# Patient Record
Sex: Male | Born: 1985 | Race: White | Hispanic: No | Marital: Single | Smoking: Never smoker
Health system: Southern US, Community
[De-identification: ages and names within clinical notes are randomized; demographics above are authoritative.]

## PROBLEM LIST (undated history)

## (undated) DIAGNOSIS — F419 Anxiety disorder, unspecified: Secondary | ICD-10-CM

## (undated) DIAGNOSIS — Z87442 Personal history of urinary calculi: Secondary | ICD-10-CM

---

## 2005-05-18 ENCOUNTER — Emergency Department (HOSPITAL_COMMUNITY): Admission: EM | Admit: 2005-05-18 | Discharge: 2005-05-18 | Payer: Self-pay | Admitting: Emergency Medicine

## 2006-03-18 ENCOUNTER — Emergency Department (HOSPITAL_COMMUNITY): Admission: EM | Admit: 2006-03-18 | Discharge: 2006-03-18 | Payer: Self-pay | Admitting: Emergency Medicine

## 2006-05-13 ENCOUNTER — Emergency Department (HOSPITAL_COMMUNITY): Admission: EM | Admit: 2006-05-13 | Discharge: 2006-05-14 | Payer: Self-pay | Admitting: Emergency Medicine

## 2006-06-23 ENCOUNTER — Ambulatory Visit: Payer: Self-pay | Admitting: Family Medicine

## 2006-07-19 ENCOUNTER — Ambulatory Visit: Payer: Self-pay | Admitting: Family Medicine

## 2011-04-30 ENCOUNTER — Emergency Department (HOSPITAL_COMMUNITY)
Admission: EM | Admit: 2011-04-30 | Discharge: 2011-04-30 | Disposition: A | Discharge status: Home / Self Care | Payer: Self-pay | Attending: Emergency Medicine | Admitting: Emergency Medicine

## 2011-04-30 ENCOUNTER — Emergency Department (HOSPITAL_COMMUNITY): Payer: Self-pay

## 2011-04-30 DIAGNOSIS — M129 Arthropathy, unspecified: Secondary | ICD-10-CM | POA: Insufficient documentation

## 2011-04-30 DIAGNOSIS — S0003XA Contusion of scalp, initial encounter: Secondary | ICD-10-CM | POA: Insufficient documentation

## 2011-04-30 DIAGNOSIS — M2550 Pain in unspecified joint: Secondary | ICD-10-CM | POA: Insufficient documentation

## 2011-04-30 DIAGNOSIS — S6000XA Contusion of unspecified finger without damage to nail, initial encounter: Secondary | ICD-10-CM | POA: Insufficient documentation

## 2011-04-30 DIAGNOSIS — M79609 Pain in unspecified limb: Secondary | ICD-10-CM | POA: Insufficient documentation

## 2011-04-30 DIAGNOSIS — S62009A Unspecified fracture of navicular [scaphoid] bone of unspecified wrist, initial encounter for closed fracture: Secondary | ICD-10-CM | POA: Insufficient documentation

## 2011-04-30 DIAGNOSIS — M7989 Other specified soft tissue disorders: Secondary | ICD-10-CM | POA: Insufficient documentation

## 2011-04-30 DIAGNOSIS — F329 Major depressive disorder, single episode, unspecified: Secondary | ICD-10-CM | POA: Insufficient documentation

## 2011-04-30 DIAGNOSIS — F3289 Other specified depressive episodes: Secondary | ICD-10-CM | POA: Insufficient documentation

## 2011-04-30 DIAGNOSIS — IMO0002 Reserved for concepts with insufficient information to code with codable children: Secondary | ICD-10-CM | POA: Insufficient documentation

## 2011-04-30 DIAGNOSIS — R51 Headache: Secondary | ICD-10-CM | POA: Insufficient documentation

## 2011-05-15 ENCOUNTER — Emergency Department (HOSPITAL_COMMUNITY)
Admission: EM | Admit: 2011-05-15 | Discharge: 2011-05-15 | Disposition: A | Discharge status: Other Acute Inpatient Hospital | Payer: Self-pay | Source: Home / Self Care | Attending: Emergency Medicine | Admitting: Emergency Medicine

## 2011-05-15 ENCOUNTER — Emergency Department (HOSPITAL_COMMUNITY): Payer: Self-pay

## 2011-05-15 ENCOUNTER — Inpatient Hospital Stay (HOSPITAL_COMMUNITY): Payer: Self-pay

## 2011-05-15 ENCOUNTER — Inpatient Hospital Stay (HOSPITAL_COMMUNITY)
Admission: EM | Admit: 2011-05-15 | Discharge: 2011-05-16 | DRG: 087 | Disposition: A | Discharge status: Home / Self Care | Payer: Self-pay | Source: Ambulatory Visit | Attending: Neurosurgery | Admitting: Neurosurgery

## 2011-05-15 DIAGNOSIS — S02109A Fracture of base of skull, unspecified side, initial encounter for closed fracture: Principal | ICD-10-CM | POA: Diagnosis present

## 2011-05-15 DIAGNOSIS — F1010 Alcohol abuse, uncomplicated: Secondary | ICD-10-CM | POA: Insufficient documentation

## 2011-05-15 DIAGNOSIS — F3289 Other specified depressive episodes: Secondary | ICD-10-CM | POA: Diagnosis present

## 2011-05-15 DIAGNOSIS — Y9229 Other specified public building as the place of occurrence of the external cause: Secondary | ICD-10-CM

## 2011-05-15 DIAGNOSIS — R51 Headache: Secondary | ICD-10-CM | POA: Insufficient documentation

## 2011-05-15 DIAGNOSIS — S62009A Unspecified fracture of navicular [scaphoid] bone of unspecified wrist, initial encounter for closed fracture: Secondary | ICD-10-CM | POA: Diagnosis present

## 2011-05-15 DIAGNOSIS — F17200 Nicotine dependence, unspecified, uncomplicated: Secondary | ICD-10-CM | POA: Diagnosis present

## 2011-05-15 DIAGNOSIS — F329 Major depressive disorder, single episode, unspecified: Secondary | ICD-10-CM | POA: Diagnosis present

## 2011-05-15 DIAGNOSIS — IMO0002 Reserved for concepts with insufficient information to code with codable children: Secondary | ICD-10-CM | POA: Diagnosis present

## 2011-05-15 DIAGNOSIS — I609 Nontraumatic subarachnoid hemorrhage, unspecified: Secondary | ICD-10-CM | POA: Insufficient documentation

## 2011-05-15 DIAGNOSIS — Y998 Other external cause status: Secondary | ICD-10-CM

## 2011-05-15 LAB — MRSA PCR SCREENING: MRSA by PCR: NEGATIVE

## 2011-05-15 LAB — POCT I-STAT, CHEM 8
BUN: 12 mg/dL (ref 6–23)
Calcium, Ion: 1.14 mmol/L (ref 1.12–1.32)
Chloride: 105 mEq/L (ref 96–112)
Hemoglobin: 16.7 g/dL (ref 13.0–17.0)
TCO2: 23 mmol/L (ref 0–100)

## 2011-05-15 LAB — BLOOD GAS, ARTERIAL
Drawn by: 308601
FIO2: 0.21 %
O2 Saturation: 95.9 %
Patient temperature: 37
pH, Arterial: 7.321 — ABNORMAL LOW (ref 7.350–7.450)
pO2, Arterial: 98.9 mmHg (ref 80.0–100.0)

## 2011-05-15 LAB — ETHANOL: Alcohol, Ethyl (B): 228 mg/dL — ABNORMAL HIGH (ref 0–11)

## 2011-05-16 ENCOUNTER — Inpatient Hospital Stay (HOSPITAL_COMMUNITY): Payer: Self-pay
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
## 2012-11-24 IMAGING — CR DG HAND COMPLETE 3+V*L*
4 series · 4 of 4 positions shown · non-contrast
Comparison: 04/30/2011.

CLINICAL DATA: 25-year-old male with wrist and hand injury.

LEFT HAND - COMPLETE 3+ VIEW

[x hand pa left]
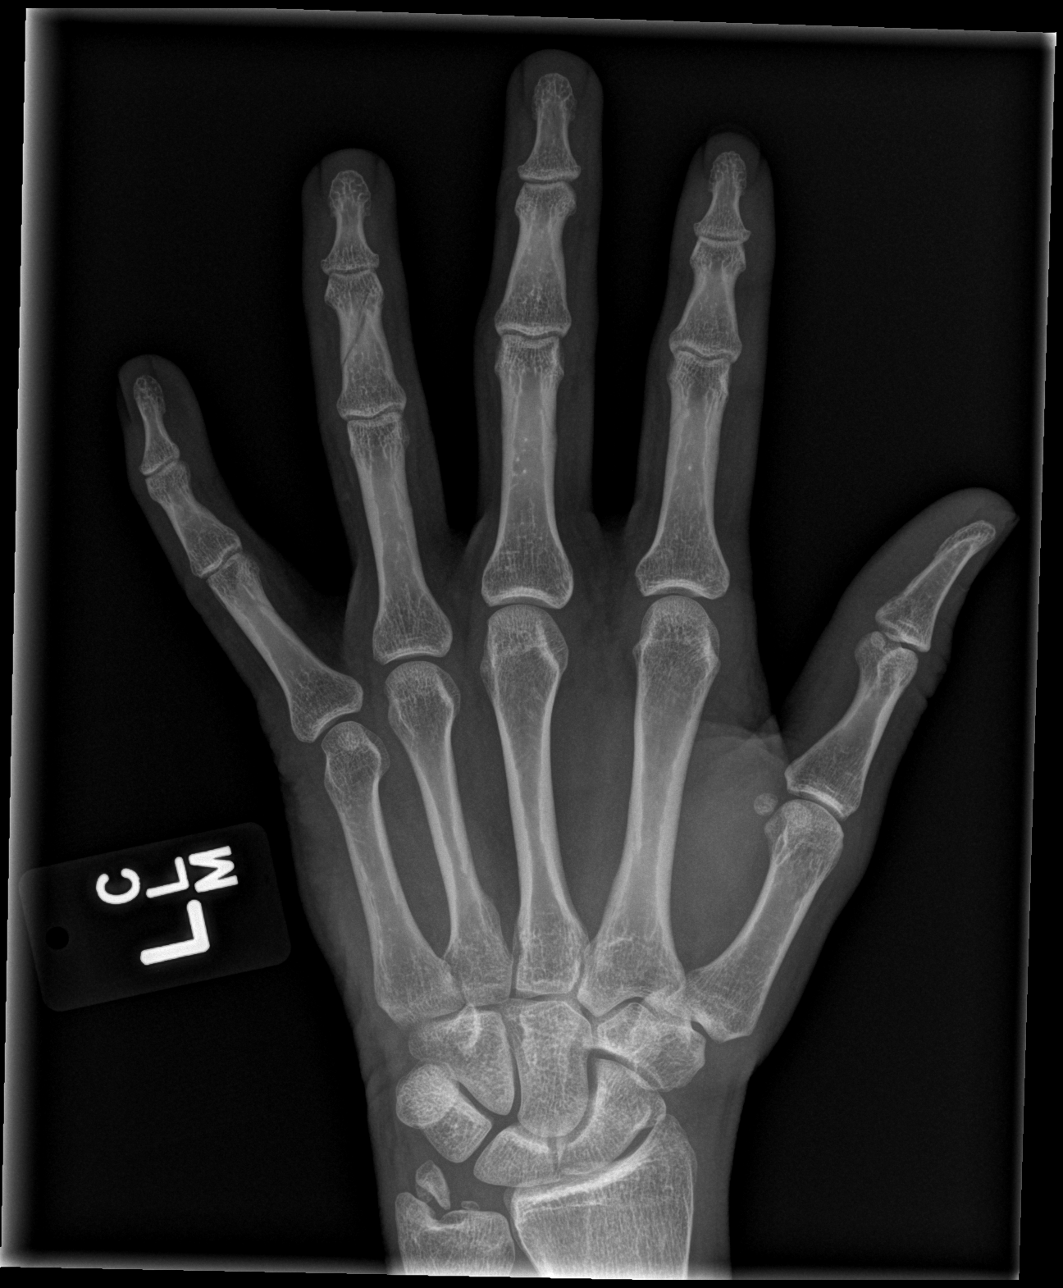

[x hand obl left (1 of 2)]
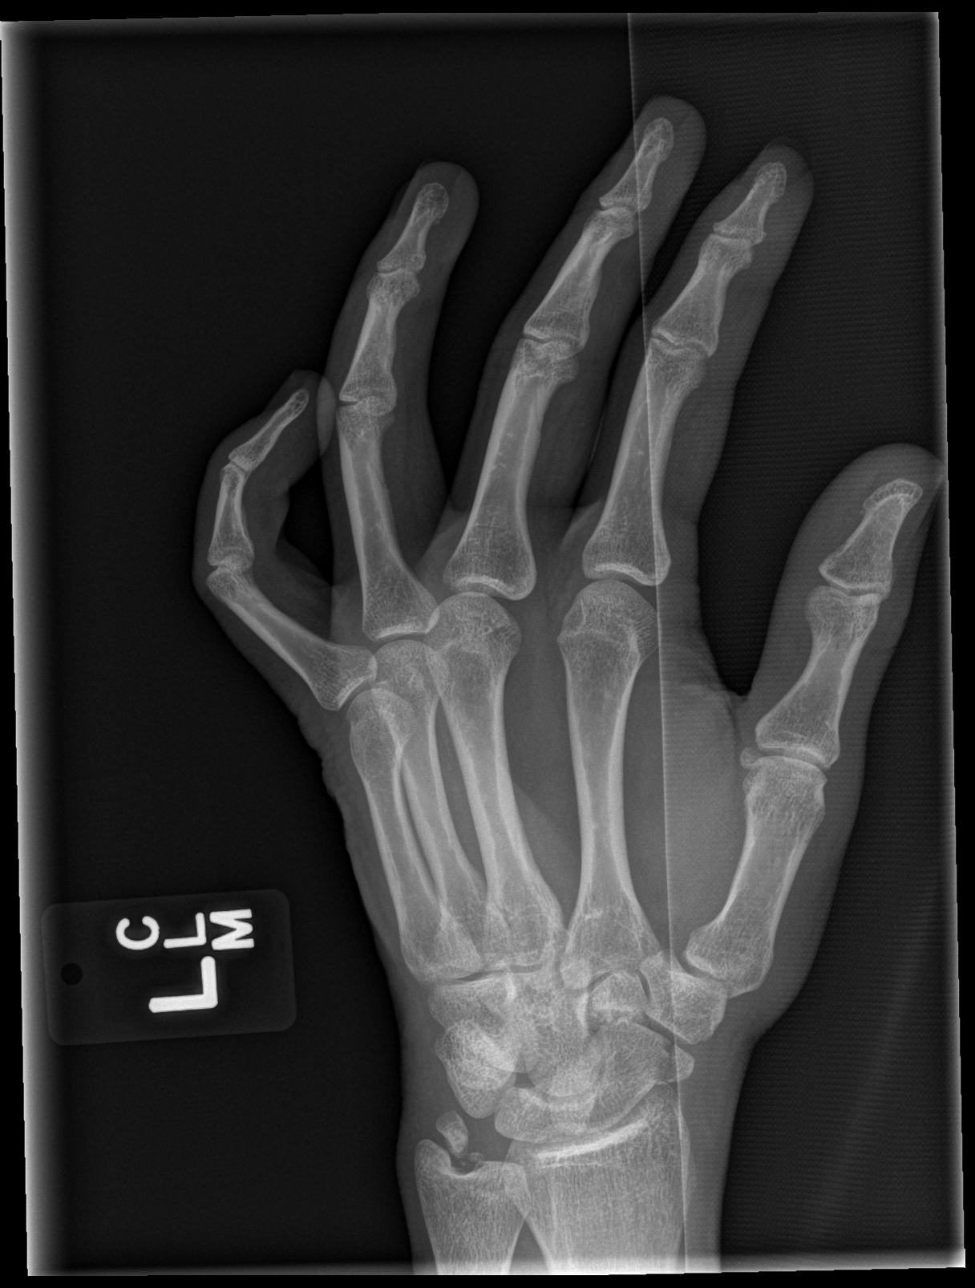

[x hand obl left (2 of 2)]
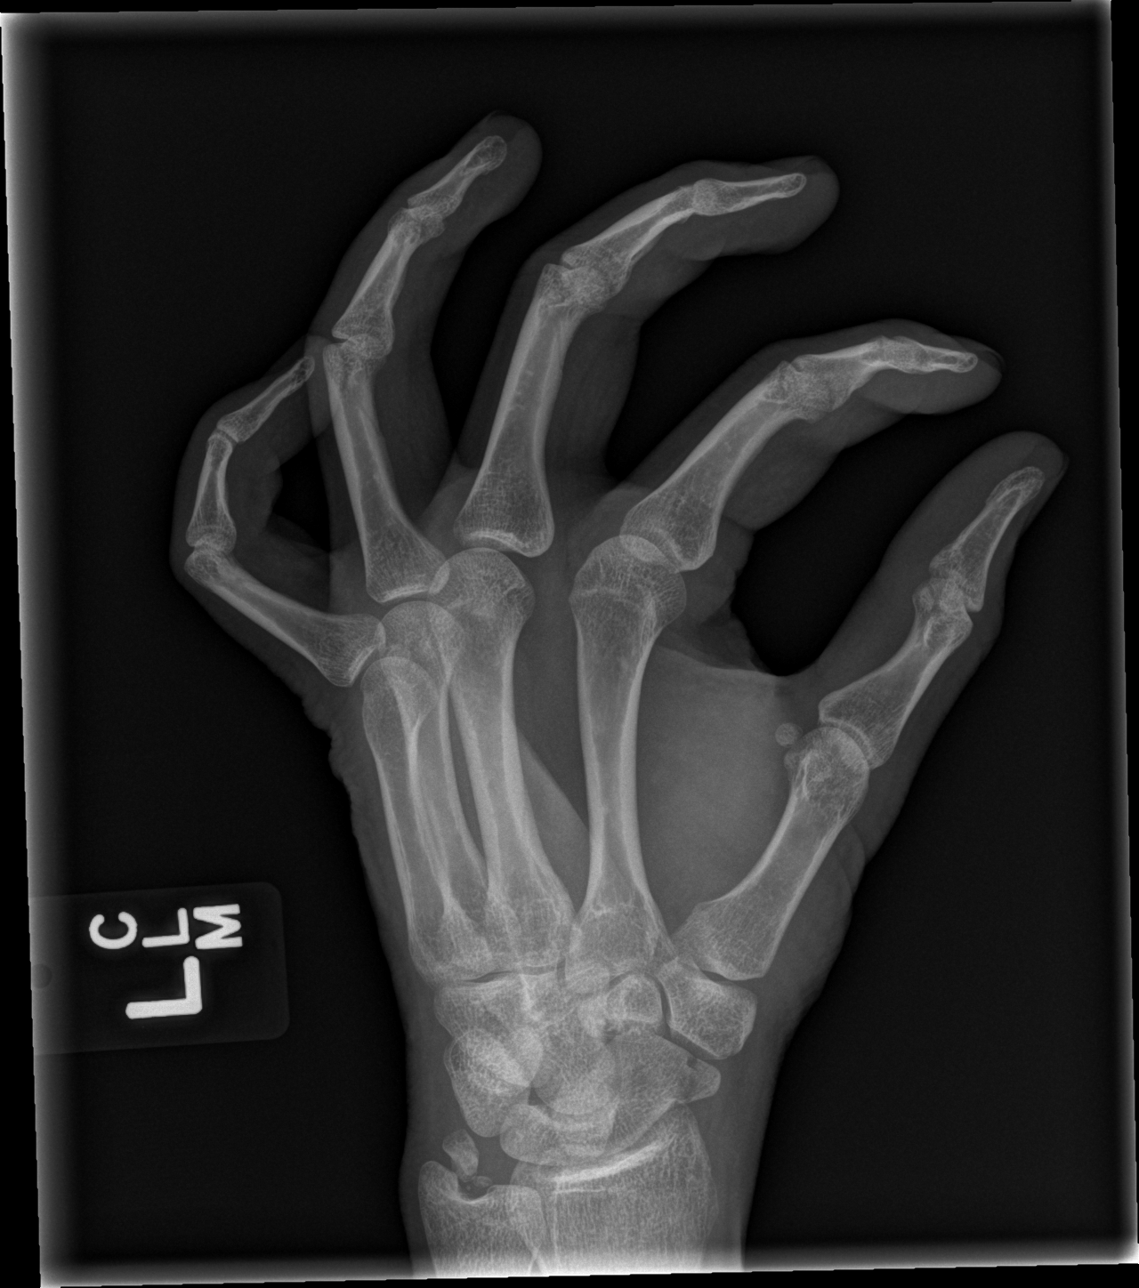

[x hand lat left]
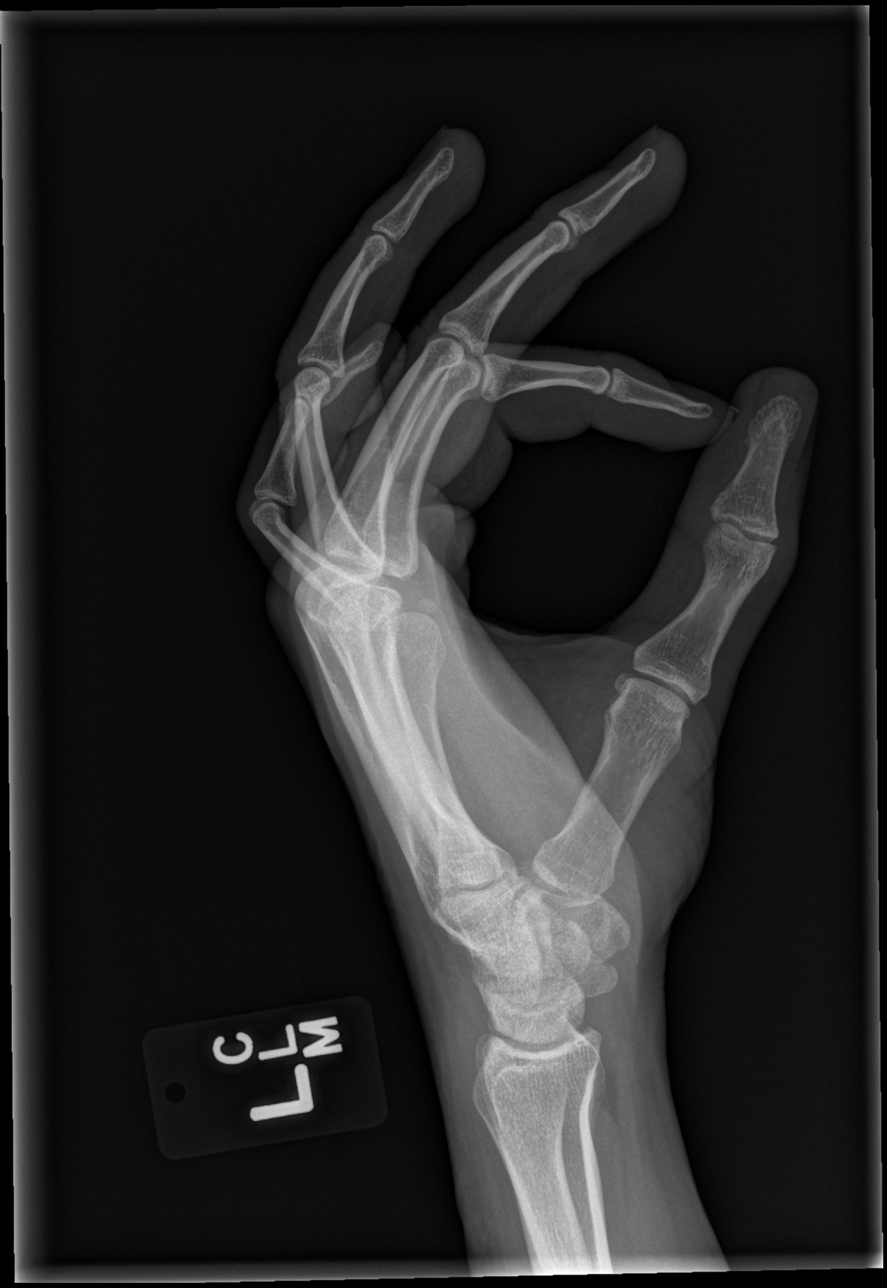

[4 of 4 positions shown; findings below may reference images not displayed]

FINDINGS: Interval increased lucency along the distal pole of the
left scaphoid.  Other carpal bones appear stable and intact.
Carpal bone alignment preserved.  Chronic ulnar styloid fracture re-
identified.  Oblique fracture of the left fourth middle phalanx re-
identified and not significantly changed.  No new osseous injury
identified.
IMPRESSION: 1.  Interval increased lucency along the fracture plane at the
distal pole of the left scaphoid.  Otherwise stable alignment.
2.  Unchanged appearance of the oblique fracture left fourth middle
phalanx.

## 2016-05-24 DIAGNOSIS — F909 Attention-deficit hyperactivity disorder, unspecified type: Secondary | ICD-10-CM | POA: Diagnosis not present

## 2016-05-24 DIAGNOSIS — F411 Generalized anxiety disorder: Secondary | ICD-10-CM | POA: Diagnosis not present

## 2016-08-18 DIAGNOSIS — F411 Generalized anxiety disorder: Secondary | ICD-10-CM | POA: Diagnosis not present

## 2016-08-18 DIAGNOSIS — F909 Attention-deficit hyperactivity disorder, unspecified type: Secondary | ICD-10-CM | POA: Diagnosis not present

## 2016-08-18 DIAGNOSIS — Z79899 Other long term (current) drug therapy: Secondary | ICD-10-CM | POA: Diagnosis not present

## 2016-08-18 DIAGNOSIS — F330 Major depressive disorder, recurrent, mild: Secondary | ICD-10-CM | POA: Diagnosis not present

## 2016-08-18 DIAGNOSIS — Z5181 Encounter for therapeutic drug level monitoring: Secondary | ICD-10-CM | POA: Diagnosis not present

## 2016-11-17 DIAGNOSIS — Z79899 Other long term (current) drug therapy: Secondary | ICD-10-CM | POA: Diagnosis not present

## 2016-11-17 DIAGNOSIS — F909 Attention-deficit hyperactivity disorder, unspecified type: Secondary | ICD-10-CM | POA: Diagnosis not present

## 2016-11-17 DIAGNOSIS — Z5181 Encounter for therapeutic drug level monitoring: Secondary | ICD-10-CM | POA: Diagnosis not present

## 2016-11-17 DIAGNOSIS — F411 Generalized anxiety disorder: Secondary | ICD-10-CM | POA: Diagnosis not present

## 2017-02-11 DIAGNOSIS — Z79899 Other long term (current) drug therapy: Secondary | ICD-10-CM | POA: Diagnosis not present

## 2017-02-11 DIAGNOSIS — F909 Attention-deficit hyperactivity disorder, unspecified type: Secondary | ICD-10-CM | POA: Diagnosis not present

## 2017-02-11 DIAGNOSIS — Z5181 Encounter for therapeutic drug level monitoring: Secondary | ICD-10-CM | POA: Diagnosis not present

## 2017-05-10 DIAGNOSIS — F411 Generalized anxiety disorder: Secondary | ICD-10-CM | POA: Diagnosis not present

## 2017-05-10 DIAGNOSIS — F909 Attention-deficit hyperactivity disorder, unspecified type: Secondary | ICD-10-CM | POA: Diagnosis not present

## 2017-08-10 DIAGNOSIS — F411 Generalized anxiety disorder: Secondary | ICD-10-CM | POA: Diagnosis not present

## 2017-10-03 DIAGNOSIS — M79672 Pain in left foot: Secondary | ICD-10-CM | POA: Diagnosis not present

## 2017-10-04 ENCOUNTER — Ambulatory Visit: Payer: Self-pay | Admitting: Podiatry

## 2017-11-17 DIAGNOSIS — F909 Attention-deficit hyperactivity disorder, unspecified type: Secondary | ICD-10-CM | POA: Diagnosis not present

## 2017-11-17 DIAGNOSIS — F411 Generalized anxiety disorder: Secondary | ICD-10-CM | POA: Diagnosis not present

## 2018-01-18 DIAGNOSIS — F411 Generalized anxiety disorder: Secondary | ICD-10-CM | POA: Diagnosis not present

## 2018-01-18 DIAGNOSIS — Z681 Body mass index (BMI) 19 or less, adult: Secondary | ICD-10-CM | POA: Diagnosis not present

## 2018-01-18 DIAGNOSIS — F909 Attention-deficit hyperactivity disorder, unspecified type: Secondary | ICD-10-CM | POA: Diagnosis not present

## 2018-01-18 DIAGNOSIS — Z0000 Encounter for general adult medical examination without abnormal findings: Secondary | ICD-10-CM | POA: Diagnosis not present

## 2018-01-18 DIAGNOSIS — Z1389 Encounter for screening for other disorder: Secondary | ICD-10-CM | POA: Diagnosis not present

## 2018-02-15 DIAGNOSIS — R768 Other specified abnormal immunological findings in serum: Secondary | ICD-10-CM | POA: Diagnosis not present

## 2018-04-18 DIAGNOSIS — F419 Anxiety disorder, unspecified: Secondary | ICD-10-CM | POA: Diagnosis not present

## 2018-04-18 DIAGNOSIS — F909 Attention-deficit hyperactivity disorder, unspecified type: Secondary | ICD-10-CM | POA: Diagnosis not present

## 2018-07-17 DIAGNOSIS — F909 Attention-deficit hyperactivity disorder, unspecified type: Secondary | ICD-10-CM | POA: Diagnosis not present

## 2018-07-17 DIAGNOSIS — F411 Generalized anxiety disorder: Secondary | ICD-10-CM | POA: Diagnosis not present

## 2018-11-03 DIAGNOSIS — F411 Generalized anxiety disorder: Secondary | ICD-10-CM | POA: Diagnosis not present

## 2018-11-03 DIAGNOSIS — F909 Attention-deficit hyperactivity disorder, unspecified type: Secondary | ICD-10-CM | POA: Diagnosis not present

## 2019-01-02 DIAGNOSIS — Z5181 Encounter for therapeutic drug level monitoring: Secondary | ICD-10-CM | POA: Diagnosis not present

## 2019-01-02 DIAGNOSIS — F909 Attention-deficit hyperactivity disorder, unspecified type: Secondary | ICD-10-CM | POA: Diagnosis not present

## 2019-01-02 DIAGNOSIS — F411 Generalized anxiety disorder: Secondary | ICD-10-CM | POA: Diagnosis not present

## 2019-01-02 DIAGNOSIS — Z79899 Other long term (current) drug therapy: Secondary | ICD-10-CM | POA: Diagnosis not present

## 2019-03-28 DIAGNOSIS — F909 Attention-deficit hyperactivity disorder, unspecified type: Secondary | ICD-10-CM | POA: Diagnosis not present

## 2021-05-23 ENCOUNTER — Other Ambulatory Visit: Payer: Self-pay

## 2021-05-23 ENCOUNTER — Ambulatory Visit
Admission: EM | Admit: 2021-05-23 | Discharge: 2021-05-23 | Disposition: A | Discharge status: Home / Self Care | Payer: 59

## 2021-05-23 ENCOUNTER — Encounter: Payer: Self-pay | Admitting: General Practice

## 2021-05-23 DIAGNOSIS — S39012A Strain of muscle, fascia and tendon of lower back, initial encounter: Secondary | ICD-10-CM | POA: Diagnosis not present

## 2021-05-23 DIAGNOSIS — M545 Low back pain, unspecified: Secondary | ICD-10-CM

## 2021-05-23 MED ORDER — PREDNISONE 20 MG PO TABS: 40.0000 mg | ORAL_TABLET | Freq: Every day | ORAL | 0 refills | Status: AC

## 2021-05-23 NOTE — ED Notes (Signed)
Directly after seeing the provider and agreeing on a plan, he walked out without saying anything. When asked why he was leaving he said "I'm good, I'm good, I'm going to see a different doctor."  Left without AVS

## 2021-05-23 NOTE — ED Triage Notes (Signed)
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

## 2021-05-23 NOTE — ED Provider Notes (Signed)
[facility]    CSN: [id number] Arrival date & time: 05/23/21  1337      History   Chief Complaint Chief Complaint  Patient presents with   Back Pain    HPI [name] is a 35 y.o. male.   Patient presents with right-sided lower back pain that started a few days prior.  Denies any known injury to back but states that he started a new job that requires lots of lifting and thinks that he may have hurt it through this.  Denies any numbness or tingling to back.  Patient does have pain in left lower leg at times but is not sure if it radiates from back.  Has taken ibuprofen, warm compresses, and other over-the-counter medications with no relief of pain.  Denies any prior injuries to the back.   Back Pain  History reviewed. No pertinent past medical history.  There are no problems to display for this patient.   History reviewed. No pertinent surgical history.     Home Medications    Prior to Admission medications   Medication Sig Start Date End Date Taking? Authorizing Provider  predniSONE (DELTASONE) 20 MG tablet Take 2 tablets (40 mg total) by mouth daily for 5 days. 05/23/21 05/28/21 Yes Lance Muss, FNP  clonazePAM (KLONOPIN) 0.5 MG tablet Take 0.5 mg by mouth 2 (two) times daily as needed. 05/08/21   [provider]    Family History History reviewed. No pertinent family history.  Social History     Allergies   Patient has no allergy information on record.   Review of Systems Review of Systems  Per HPI Physical Exam Triage Vital Signs ED Triage Vitals [05/23/21 1400]  Enc Vitals Group     BP 121/81     Pulse Rate 86     Resp 18     Temp 98.1 F (36.7 C)     Temp Source Oral     SpO2 94 %     Weight      Height      Head Circumference      Peak Flow      Pain Score 8     Pain Loc      Pain Edu?      Excl. in GC?    No data found.  Updated Vital Signs BP 121/81   Pulse 86   Temp 98.1 F (36.7 C) (Oral)   Resp  18   SpO2 94%   Visual Acuity Right Eye Distance:   Left Eye Distance:   Bilateral Distance:    Right Eye Near:   Left Eye Near:    Bilateral Near:     Physical Exam Constitutional:      Appearance: Normal appearance.  HENT:     Head: Normocephalic and atraumatic.  Eyes:     Extraocular Movements: Extraocular movements intact.     Conjunctiva/sclera: Conjunctivae normal.  Pulmonary:     Effort: Pulmonary effort is normal.  Musculoskeletal:     Cervical back: Normal.     Thoracic back: Normal.     Lumbar back: No tenderness or bony tenderness. Negative right straight leg raise test and negative left straight leg raise test.     Comments: No tenderness to palpation to any part of the back.  Patient states that pain is elicited by movement.  Neurological:     General: No focal deficit present.     Mental Status: He is alert and oriented to  person, place, and time. Mental status is at baseline.     Deep Tendon Reflexes: Reflexes are normal and symmetric.     Comments: Neurovascular intact.  Psychiatric:        Mood and Affect: Mood normal.        Behavior: Behavior normal.        Thought Content: Thought content normal.        Judgment: Judgment normal.     UC Treatments / Results  Labs (all labs ordered are listed, but only abnormal results are displayed) Labs Reviewed - No data to display  EKG   Radiology No results found.  Procedures Procedures (including critical care time)  Medications Ordered in UC Medications - No data to display  Initial Impression / Assessment and Plan / UC Course  I have reviewed the triage vital signs and the nursing notes.  Pertinent labs & imaging results that were available during my care of the patient were reviewed by me and considered in my medical decision making (see chart for details).     Physical exam and clinical symptoms are most consistent with a lower lumbar strain.  Will treat with prednisone x5 days due to pain  being refractory to over-the-counter remedies.  Also advised patient to alternate ice and heat application to affected area of pain.  No red flags seen on exam.Discussed strict return precautions. Patient verbalized understanding and is agreeable with plan.   While trying to order prednisone and complete AVS, patient walked out of room and outside of the building.  Nurse went to get patient stating that we needed to give him the AVS summary, and nurse reported that patient stated "he was good and was going to see another doctor".  Attempted to call patient by phone to inquire about this but no answer.  Unable to leave voicemail.  Patient was agreeable to treatment plan and was very pleasant during physical exam, so not sure why patient decided to leave before discharge summary was provided to patient and medication sent. Although, will send prednisone steroid for patient to pick up since original treatment plan was agreed upon and visit was complete. Final Clinical Impressions(s) / UC Diagnoses   Final diagnoses:  Lumbar strain, initial encounter  Acute right-sided low back pain without sciatica   Discharge Instructions   None    ED Prescriptions     Medication Sig Dispense Auth. Provider   predniSONE (DELTASONE) 20 MG tablet Take 2 tablets (40 mg total) by mouth daily for 5 days. 10 tablet Lance Muss, FNP      PDMP not reviewed this encounter.   Lance Muss, FNP 05/23/21 [phone]

## 2021-10-28 DIAGNOSIS — R109 Unspecified abdominal pain: Secondary | ICD-10-CM | POA: Diagnosis not present

## 2021-10-28 DIAGNOSIS — N201 Calculus of ureter: Secondary | ICD-10-CM | POA: Diagnosis not present

## 2021-10-28 DIAGNOSIS — F17210 Nicotine dependence, cigarettes, uncomplicated: Secondary | ICD-10-CM | POA: Diagnosis not present

## 2021-10-28 DIAGNOSIS — N132 Hydronephrosis with renal and ureteral calculous obstruction: Secondary | ICD-10-CM | POA: Diagnosis not present

## 2021-10-28 DIAGNOSIS — R1084 Generalized abdominal pain: Secondary | ICD-10-CM | POA: Diagnosis not present

## 2021-10-28 DIAGNOSIS — K529 Noninfective gastroenteritis and colitis, unspecified: Secondary | ICD-10-CM | POA: Diagnosis not present

## 2021-10-29 DIAGNOSIS — R109 Unspecified abdominal pain: Secondary | ICD-10-CM | POA: Diagnosis not present

## 2021-10-29 DIAGNOSIS — Z87442 Personal history of urinary calculi: Secondary | ICD-10-CM | POA: Diagnosis not present

## 2021-10-29 DIAGNOSIS — N23 Unspecified renal colic: Secondary | ICD-10-CM | POA: Diagnosis not present

## 2021-10-29 DIAGNOSIS — N201 Calculus of ureter: Secondary | ICD-10-CM | POA: Diagnosis not present

## 2021-10-30 DIAGNOSIS — N200 Calculus of kidney: Secondary | ICD-10-CM | POA: Diagnosis not present

## 2021-11-01 ENCOUNTER — Emergency Department (HOSPITAL_COMMUNITY): Payer: BC Managed Care – PPO

## 2021-11-01 ENCOUNTER — Other Ambulatory Visit: Payer: Self-pay

## 2021-11-01 ENCOUNTER — Emergency Department (HOSPITAL_COMMUNITY)
Admission: EM | Admit: 2021-11-01 | Discharge: 2021-11-01 | Disposition: A | Discharge status: Home / Self Care | Payer: BC Managed Care – PPO | Attending: Emergency Medicine | Admitting: Emergency Medicine

## 2021-11-01 ENCOUNTER — Encounter (HOSPITAL_COMMUNITY): Payer: Self-pay | Admitting: Emergency Medicine

## 2021-11-01 DIAGNOSIS — R1011 Right upper quadrant pain: Secondary | ICD-10-CM | POA: Insufficient documentation

## 2021-11-01 DIAGNOSIS — E876 Hypokalemia: Secondary | ICD-10-CM | POA: Diagnosis not present

## 2021-11-01 DIAGNOSIS — N200 Calculus of kidney: Secondary | ICD-10-CM | POA: Diagnosis not present

## 2021-11-01 DIAGNOSIS — N23 Unspecified renal colic: Secondary | ICD-10-CM

## 2021-11-01 DIAGNOSIS — R109 Unspecified abdominal pain: Secondary | ICD-10-CM | POA: Diagnosis not present

## 2021-11-01 DIAGNOSIS — N390 Urinary tract infection, site not specified: Secondary | ICD-10-CM | POA: Diagnosis not present

## 2021-11-01 HISTORY — DX: Anxiety disorder, unspecified: F41.9

## 2021-11-01 LAB — CBC
HCT: 40.4 % (ref 39.0–52.0)
Hemoglobin: 14 g/dL (ref 13.0–17.0)
MCH: 28.9 pg (ref 26.0–34.0)
MCHC: 34.7 g/dL (ref 30.0–36.0)
MCV: 83.5 fL (ref 80.0–100.0)
Platelets: 218 10*3/uL (ref 150–400)
RBC: 4.84 MIL/uL (ref 4.22–5.81)
RDW: 13.6 % (ref 11.5–15.5)
WBC: 9.5 10*3/uL (ref 4.0–10.5)
nRBC: 0 % (ref 0.0–0.2)

## 2021-11-01 LAB — COMPREHENSIVE METABOLIC PANEL
ALT: 106 U/L — ABNORMAL HIGH (ref 0–44)
AST: 86 U/L — ABNORMAL HIGH (ref 15–41)
Albumin: 4.4 g/dL (ref 3.5–5.0)
Alkaline Phosphatase: 39 U/L (ref 38–126)
Anion gap: 12 (ref 5–15)
BUN: 9 mg/dL (ref 6–20)
CO2: 24 mmol/L (ref 22–32)
Calcium: 9.3 mg/dL (ref 8.9–10.3)
Chloride: 101 mmol/L (ref 98–111)
Creatinine, Ser: 0.8 mg/dL (ref 0.61–1.24)
GFR, Estimated: >60 mL/min (ref >60)
Glucose, Bld: 102 mg/dL — ABNORMAL HIGH (ref 70–99)
Potassium: 3.2 mmol/L — ABNORMAL LOW (ref 3.5–5.1)
Sodium: 137 mmol/L (ref 135–145)
Total Bilirubin: 0.8 mg/dL (ref 0.3–1.2)
Total Protein: 7.5 g/dL (ref 6.5–8.1)

## 2021-11-01 LAB — URINALYSIS, ROUTINE W REFLEX MICROSCOPIC
Bilirubin Urine: NEGATIVE
Glucose, UA: NEGATIVE mg/dL
Ketones, ur: 20 mg/dL — AB
Nitrite: NEGATIVE
Protein, ur: NEGATIVE mg/dL
Specific Gravity, Urine: 1.006 (ref 1.005–1.030)
pH: 6 (ref 5.0–8.0)

## 2021-11-01 LAB — LIPASE, BLOOD: Lipase: 35 U/L (ref 11–51)

## 2021-11-01 MED ORDER — POTASSIUM CHLORIDE CRYS ER 20 MEQ PO TBCR
40.0000 meq | EXTENDED_RELEASE_TABLET | Freq: Once | ORAL | Status: AC
  Administered 2021-11-01: 40 meq via ORAL
  Filled 2021-11-01: qty 2

## 2021-11-01 MED ORDER — OXYCODONE-ACETAMINOPHEN 5-325 MG PO TABS: 1.0000 | ORAL_TABLET | Freq: Four times a day (QID) | ORAL | 0 refills | Status: DC | PRN

## 2021-11-01 MED ORDER — CEPHALEXIN 500 MG PO CAPS: 1000.0000 mg | ORAL_CAPSULE | Freq: Two times a day (BID) | ORAL | 0 refills | Status: DC

## 2021-11-01 MED ORDER — HYDROMORPHONE HCL 1 MG/ML IJ SOLN
1.0000 mg | Freq: Once | INTRAMUSCULAR | Status: AC
  Administered 2021-11-01: 1 mg via INTRAVENOUS
  Filled 2021-11-01: qty 1

## 2021-11-01 MED ORDER — KETOROLAC TROMETHAMINE 30 MG/ML IJ SOLN
30.0000 mg | Freq: Once | INTRAMUSCULAR | Status: AC
  Administered 2021-11-01: 30 mg via INTRAVENOUS
  Filled 2021-11-01: qty 1

## 2021-11-01 MED ORDER — SODIUM CHLORIDE 0.9 % IV SOLN
1.0000 g | Freq: Once | INTRAVENOUS | Status: AC
  Administered 2021-11-01: 1 g via INTRAVENOUS
  Filled 2021-11-01: qty 10

## 2021-11-01 NOTE — Discharge Instructions (Addendum)
It was our pleasure to provide your ER care today - we hope that you feel better. ? ?Drink plenty of fluids/stay well hydrated. Strain urine. Take antibiotic for possible uti.  ? ?Take flomax as prescribed. Take motrin or aleve as need for pain. You may also take percocet as need for pain. No driving for the next 6 hours or when taking percocet. Also, do not take tylenol or acetaminophen containing medication when taking percocet.  ? ?Follow up with urologist this week - call office tomorrow AM to arrange appointment.  ? ?From your labs, your potassium level is mildly low - eat plenty of fruits and vegetables, and follow up with primary care doctor.  ? ?Return to ER if worse, new symptoms, fevers, severe or intractable pain, persistent vomiting, or other concern.  ? ?

## 2021-11-01 NOTE — ED Triage Notes (Signed)
Pt reports right sided abdominal pain. Pt states that he was dx with a kidney stone last week. Pt states he has taken his pain medication at home with no relief.  ?

## 2021-11-01 NOTE — ED Provider Notes (Signed)
?[facility]-EMERGENCY DEPT ?Provider Note ? ? ?CSN: [id number] ?Arrival date & time: 11/01/21  1315 ? ?  ? ?History ? ?Chief Complaint  ?Patient presents with  ? Flank Pain  ? ? ?[name] is a 36 y.o. male. ? ?Patient c/o pain right upper abdomen in past few days. Symptoms acute onset, moderate, persistent, non radiating. States went to [facility] and was told had kidney stone - unsure where stone located or size. No dysuria or hematuria. C/o persistent pain. No nausea/vomiting. Having normal bms. No abd distension. No fever or chills.  ? ?The history is provided by the patient, a relative and medical records.  ?Flank Pain ?Associated symptoms include abdominal pain. Pertinent negatives include no chest pain, no headaches and no shortness of breath.  ? ?  ? ?Home Medications ?Prior to Admission medications   ?Medication Sig Start Date End Date Taking? Authorizing Provider  ?clonazePAM (KLONOPIN) 0.5 MG tablet Take 0.5 mg by mouth 2 (two) times daily as needed. 05/08/21   [provider]  ?   ? ?Allergies    ?Patient has no allergy information on record.   ? ?Review of Systems   ?Review of Systems  ?Constitutional:  Negative for fever.  ?HENT:  Negative for sore throat.   ?Eyes:  Negative for redness.  ?Respiratory:  Negative for cough and shortness of breath.   ?Cardiovascular:  Negative for chest pain.  ?Gastrointestinal:  Positive for abdominal pain. Negative for vomiting.  ?Genitourinary:  Negative for dysuria and hematuria.  ?Musculoskeletal:  Negative for back pain.  ?Skin:  Negative for rash.  ?Neurological:  Negative for headaches.  ?Hematological:  Does not bruise/bleed easily.  ?Psychiatric/Behavioral:  Negative for confusion.   ? ?Physical Exam ?Updated Vital Signs ?BP (!) 147/85 (BP Location: Right Arm)   Pulse 71   Temp 97.9 ?F (36.6 ?C) (Oral)   Resp 18   SpO2 99%  ?Physical Exam ?Vitals and nursing note reviewed.  ?Constitutional:   ?   Appearance: Normal  appearance. He is well-developed.  ?HENT:  ?   Head: Atraumatic.  ?   Nose: Nose normal.  ?   Mouth/Throat:  ?   Mouth: Mucous membranes are moist.  ?Eyes:  ?   General: No scleral icterus. ?   Conjunctiva/sclera: Conjunctivae normal.  ?Neck:  ?   Trachea: No tracheal deviation.  ?Cardiovascular:  ?   Rate and Rhythm: Normal rate.  ?   Pulses: Normal pulses.  ?   Heart sounds: Normal heart sounds.  ?Pulmonary:  ?   Effort: Pulmonary effort is normal. No accessory muscle usage or respiratory distress.  ?   Breath sounds: Normal breath sounds.  ?Abdominal:  ?   General: Bowel sounds are normal. There is no distension.  ?   Palpations: Abdomen is soft. There is no mass.  ?   Tenderness: There is no abdominal tenderness. There is no guarding or rebound.  ?   Hernia: No hernia is present.  ?Genitourinary: ?   Comments: No cva tenderness. ?Musculoskeletal:     ?   General: No swelling.  ?   Cervical back: Neck supple.  ?Skin: ?   General: Skin is warm and dry.  ?   Findings: No rash.  ?Neurological:  ?   Mental Status: He is alert.  ?   Comments: Alert, speech clear.   ?Psychiatric:     ?   Mood and Affect: Mood normal.  ? ? ?ED Results / Procedures /  Treatments   ?Labs ?(all labs ordered are listed, but only abnormal results are displayed) ?Results for orders placed or performed during the hospital encounter of 11/01/21  ?Urinalysis, Routine w reflex microscopic Urine, Clean Catch  ?Result Value Ref Range  ? Color, Urine YELLOW YELLOW  ? APPearance CLEAR CLEAR  ? Specific Gravity, Urine 1.006 1.005 - 1.030  ? pH 6.0 5.0 - 8.0  ? Glucose, UA NEGATIVE NEGATIVE mg/dL  ? Hgb urine dipstick MODERATE (A) NEGATIVE  ? Bilirubin Urine NEGATIVE NEGATIVE  ? Ketones, ur 20 (A) NEGATIVE mg/dL  ? Protein, ur NEGATIVE NEGATIVE mg/dL  ? Nitrite NEGATIVE NEGATIVE  ? Leukocytes,Ua SMALL (A) NEGATIVE  ? RBC / HPF 0-5 0 - 5 RBC/hpf  ? WBC, UA 11-20 0 - 5 WBC/hpf  ? Bacteria, UA RARE (A) NONE SEEN  ? Squamous Epithelial / LPF 0-5 0 - 5  ?  Mucus PRESENT   ?Lipase, blood  ?Result Value Ref Range  ? Lipase 35 11 - 51 U/L  ?CBC  ?Result Value Ref Range  ? WBC 9.5 4.0 - 10.5 K/uL  ? RBC 4.84 4.22 - 5.81 MIL/uL  ? Hemoglobin 14.0 13.0 - 17.0 g/dL  ? HCT 40.4 39.0 - 52.0 %  ? MCV 83.5 80.0 - 100.0 fL  ? MCH 28.9 26.0 - 34.0 pg  ? MCHC 34.7 30.0 - 36.0 g/dL  ? RDW 13.6 11.5 - 15.5 %  ? Platelets 218 150 - 400 K/uL  ? nRBC 0.0 0.0 - 0.2 %  ?Comprehensive metabolic panel  ?Result Value Ref Range  ? Sodium 137 135 - 145 mmol/L  ? Potassium 3.2 (L) 3.5 - 5.1 mmol/L  ? Chloride 101 98 - 111 mmol/L  ? CO2 24 22 - 32 mmol/L  ? Glucose, Bld 102 (H) 70 - 99 mg/dL  ? BUN 9 6 - 20 mg/dL  ? Creatinine, Ser 0.80 0.61 - 1.24 mg/dL  ? Calcium 9.3 8.9 - 10.3 mg/dL  ? Total Protein 7.5 6.5 - 8.1 g/dL  ? Albumin 4.4 3.5 - 5.0 g/dL  ? AST 86 (H) 15 - 41 U/L  ? ALT 106 (H) 0 - 44 U/L  ? Alkaline Phosphatase 39 38 - 126 U/L  ? Total Bilirubin 0.8 0.3 - 1.2 mg/dL  ? GFR, Estimated >60 >60 mL/min  ? Anion gap 12 5 - 15  ? ? ?EKG ?None ? ?Radiology ?No results found. ? ?Procedures ?Procedures  ? ? ?Medications Ordered in ED ?Medications  ?HYDROmorphone (DILAUDID) injection 1 mg (has no administration in time range)  ? ? ?ED Course/ Medical Decision Making/ A&P ?  ?                        ?Medical Decision Making ?Problems Addressed: ?Hypokalemia: acute illness or injury ?Kidney stone on right side: acute illness or injury with systemic symptoms ?Ureteral colic: acute illness or injury with systemic symptoms ? ?Amount and/or Complexity of Data Reviewed ?Independent Historian:  ?   Details: family member, hx ?External Data Reviewed: radiology and notes. ?Labs: ordered. Decision-making details documented in ED Course. ?Radiology: ordered and independent interpretation performed. Decision-making details documented in ED Course. ? ?Risk ?Prescription drug management. ?Parenteral controlled substances. ?Decision regarding hospitalization. ? ? ?Iv ns. Dilaudid iv. Labs ordered/sent.  Disposition including possible need for admission considered/discussed - will get labs and imaging, and reassess post meds.  ? ?Reviewed nursing notes and prior charts for additional history. External reports reviewed.  Pt does not know size of stone on recent ct or location - records not available in CareEverywhere. Sent for records from CT reading from [facility].  Pt indicates was given rx flomax from that facility and is taking.  ? ?CT reports from [facility]  - 2 by 5 mm proximal right ureteral stone.  ? ?Labs reviewed/interpreted by me - k mildly low. Kcl po. Ua pnd.  ? ?Imaging reviewed/interpreted by me - no hydro.  ? ?Toradol iv. ? ?Recheck pain improved. ? ?Rec close urology f/u. ? ?Return precautions provided.  ? ?Few wbc on ua, will cover w abx. No fever, no dysuria.  ? ?Pain improved.  ? ? ? ? ? ? ? ? ?Final Clinical Impression(s) / ED Diagnoses ?Final diagnoses:  ?None  ? ? ?Rx / DC Orders ?ED Discharge Orders   ? ? None  ? ?  ? ? ?  ?Cathren Laine, MD ?11/01/21 1510 ? ?

## 2021-11-02 DIAGNOSIS — N201 Calculus of ureter: Secondary | ICD-10-CM | POA: Diagnosis not present

## 2021-11-02 DIAGNOSIS — R1084 Generalized abdominal pain: Secondary | ICD-10-CM | POA: Diagnosis not present

## 2021-11-11 ENCOUNTER — Other Ambulatory Visit: Payer: Self-pay | Admitting: Urology

## 2021-11-12 ENCOUNTER — Encounter (HOSPITAL_COMMUNITY): Payer: Self-pay

## 2021-11-12 ENCOUNTER — Other Ambulatory Visit: Payer: Self-pay

## 2021-11-12 ENCOUNTER — Encounter (HOSPITAL_COMMUNITY)
Admission: RE | Admit: 2021-11-12 | Discharge: 2021-11-12 | Disposition: A | Discharge status: Home / Self Care | Payer: BC Managed Care – PPO | Source: Ambulatory Visit | Attending: Urology | Admitting: Urology

## 2021-11-12 VITALS — BP 139/98 | HR 84 | Temp 98.2°F | Resp 16 | Ht 67.0 in | Wt 190.0 lb

## 2021-11-12 DIAGNOSIS — Z01812 Encounter for preprocedural laboratory examination: Secondary | ICD-10-CM | POA: Diagnosis not present

## 2021-11-12 HISTORY — DX: Personal history of urinary calculi: Z87.442

## 2021-11-12 LAB — COMPREHENSIVE METABOLIC PANEL
ALT: 524 U/L — ABNORMAL HIGH (ref 0–44)
AST: 279 U/L — ABNORMAL HIGH (ref 15–41)
Albumin: 4.3 g/dL (ref 3.5–5.0)
Alkaline Phosphatase: 50 U/L (ref 38–126)
Anion gap: 6 (ref 5–15)
BUN: 14 mg/dL (ref 6–20)
CO2: 27 mmol/L (ref 22–32)
Calcium: 9.4 mg/dL (ref 8.9–10.3)
Chloride: 105 mmol/L (ref 98–111)
Creatinine, Ser: 0.86 mg/dL (ref 0.61–1.24)
GFR, Estimated: >60 mL/min (ref >60)
Glucose, Bld: 118 mg/dL — ABNORMAL HIGH (ref 70–99)
Potassium: 3.7 mmol/L (ref 3.5–5.1)
Sodium: 138 mmol/L (ref 135–145)
Total Bilirubin: 0.7 mg/dL (ref 0.3–1.2)
Total Protein: 8 g/dL (ref 6.5–8.1)

## 2021-11-12 NOTE — Progress Notes (Addendum)
COVID Vaccine Completed: no ?Date COVID Vaccine completed: ?Has received booster: ?COVID vaccine manufacturer: [employer]  ? ?Date of COVID positive in last 90 days: no ? ?PCP - Crist Fat, MD ?Cardiologist - n/a ? ?Chest x-ray - n/a ?EKG - 02/14/21 [facility] req ?Stress Test - n/a ?ECHO - n/a ?Cardiac Cath - n/a ?Pacemaker/ICD device last checked: n/a ?Spinal Cord Stimulator: n/a ? ?Bowel Prep - clears day before ? ?Sleep Study - n/a ?CPAP -  ? ?Fasting Blood Sugar - n/a ?Checks Blood Sugar _____ times a day ? ?Blood Thinner Instructions: n/a ?Aspirin Instructions: ?Last Dose: ? ?Activity level: Can go up a flight of stairs and perform activities of daily living without stopping and without symptoms of chest pain or shortness of breath. ?   ? ?Anesthesia review: AST 279, ALT 524 ? ?Patient denies shortness of breath, fever, cough and chest pain at PAT appointment ? ? ?Patient verbalized understanding of instructions that were given to them at the PAT appointment. Patient was also instructed that they will need to review over the PAT instructions again at home before surgery.  ?

## 2021-11-12 NOTE — Patient Instructions (Addendum)
DUE TO COVID-19 ONLY ONE VISITOR  (aged 37 and older)  IS ALLOWED TO COME WITH YOU AND STAY IN THE WAITING ROOM ONLY DURING PRE OP AND PROCEDURE.   ?**NO VISITORS ARE ALLOWED IN THE SHORT STAY AREA OR RECOVERY ROOM!!**  ? ? Your procedure is scheduled on: 11/12/21 ? ? Report to [facility] Main Entrance ? ?  Report to admitting at 6:30 AM ? ? Call this number if you have problems the morning of surgery [phone] ? ? Follow clear liquid diet the day before surgery ? ? You may have the following liquids until 5:45 AM DAY OF SURGERY ? ?Water ?Black Coffee (sugar ok, NO MILK/CREAM OR CREAMERS)  ?Tea (sugar ok, NO MILK/CREAM OR CREAMERS) regular and decaf                             ?Plain Jell-O (NO RED)                                           ?Fruit ices (not with fruit pulp, NO RED)                                     ?Popsicles (NO RED)                                                                  ?Juice: apple, WHITE grape, WHITE cranberry ?Sports drinks like Gatorade (NO RED) ?Clear broth(vegetable,chicken,beef) ? ?FOLLOW BOWEL PREP AND ANY ADDITIONAL PRE OP INSTRUCTIONS YOU RECEIVED FROM YOUR SURGEON'S OFFICE!!! ?  ?  ?Oral Hygiene is also important to reduce your risk of infection.                                    ?Remember - BRUSH YOUR TEETH THE MORNING OF SURGERY WITH YOUR REGULAR TOOTHPASTE ? ? Do NOT smoke after Midnight ? ? Take these medicines the morning of surgery with A SIP OF WATER: Klonopin  ?                  ?           You may not have any metal on your body including jewelry, and body piercing ? ?           Do not wear lotions, powders, cologne, or deodorant ? ?            Men may shave face and neck. ? ? Do not bring valuables to the hospital. [facility] IS NOT ?            RESPONSIBLE   FOR VALUABLES. ? ? Contacts, dentures or bridgework may not be worn into surgery. ?  ? Patients discharged on the day of surgery will not be allowed to drive home.  Someone NEEDS to stay with  you for the first 24 hours after anesthesia. ? ?  Please read over the following fact sheets you were given: IF YOU HAVE QUESTIONS ABOUT YOUR PRE-OP INSTRUCTIONS PLEASE CALL [phone] ? ?   [facility] - Preparing for Surgery ?Before surgery, you can play an important role.  Because skin is not sterile, your skin needs to be as free of germs as possible.  You can reduce the number of germs on your skin by washing with CHG (chlorahexidine gluconate) soap before surgery.  CHG is an antiseptic cleaner which kills germs and bonds with the skin to continue killing germs even after washing. ?Please DO NOT use if you have an allergy to CHG or antibacterial soaps.  If your skin becomes reddened/irritated stop using the CHG and inform your nurse when you arrive at Short Stay. ?Do not shave (including legs and underarms) for at least 48 hours prior to the first CHG shower.  You may shave your face/neck. ? ?Please follow these instructions carefully: ? 1.  Shower with CHG Soap the night before surgery and the  morning of surgery. ? 2.  If you choose to wash your hair, wash your hair first as usual with your normal  shampoo. ? 3.  After you shampoo, rinse your hair and body thoroughly to remove the shampoo.                            ? 4.  Use CHG as you would any other liquid soap.  You can apply chg directly to the skin and wash.  Gently with a scrungie or clean washcloth. ? 5.  Apply the CHG Soap to your body ONLY FROM THE NECK DOWN.   Do   not use on face/ open      ?                     Wound or open sores. Avoid contact with eyes, ears mouth and   genitals (private parts).  ?                     Engineering geologist,  Genitals (private parts) with your normal soap. ?            6.  Wash thoroughly, paying special attention to the area where your    surgery  will be performed. ? 7.  Thoroughly rinse your body with warm water from the neck down. ? 8.  DO NOT shower/wash with your normal soap after using and  rinsing off the CHG Soap. ?               9.  Pat yourself dry with a clean towel. ?           10.  Wear clean pajamas. ?           11.  Place clean sheets on your bed the night of your first shower and do not  sleep with pets. ?Day of Surgery : ?Do not apply any lotions/deodorants the morning of surgery.  Please wear clean clothes to the hospital/surgery center. ? ?FAILURE TO FOLLOW THESE INSTRUCTIONS MAY RESULT IN THE CANCELLATION OF YOUR SURGERY ? ?PATIENT SIGNATURE_________________________________ ? ?NURSE SIGNATURE__________________________________ ? ?________________________________________________________________________  ?

## 2021-11-12 NOTE — Progress Notes (Signed)
[facility] and spoke with [facility] to request orders for PAT appointment. ?

## 2021-11-12 NOTE — Progress Notes (Signed)
AST 279, ALT 524 at PAT appointment. Results routed to Dr. Daphine Deutscher. ?

## 2021-11-16 DIAGNOSIS — B1920 Unspecified viral hepatitis C without hepatic coma: Secondary | ICD-10-CM | POA: Diagnosis not present

## 2021-11-16 DIAGNOSIS — R748 Abnormal levels of other serum enzymes: Secondary | ICD-10-CM | POA: Diagnosis not present

## 2021-11-17 DIAGNOSIS — N132 Hydronephrosis with renal and ureteral calculous obstruction: Secondary | ICD-10-CM | POA: Diagnosis not present

## 2021-11-17 DIAGNOSIS — N201 Calculus of ureter: Secondary | ICD-10-CM | POA: Diagnosis not present

## 2021-11-17 NOTE — Progress Notes (Addendum)
Anesthesia Chart Review ? ? Case: [id number] Date/Time: 11/18/21 0830  ? Procedure: CYSTOSCOPY/URETEROSCOPY/HOLMIUM LASER/STENT PLACEMENT (Right) - ONLY NEEDS 45 MIN  ? Anesthesia type: General  ? Pre-op diagnosis: RIGHT URETERAL STONE  ? Location: [facility] PROCEDURE [facility]OM / [facility]  ? Surgeons: Ceasar Mons, MD  ? ?  ? ? ?DISCUSSION:36 y.o. never smoker, right ureteral stone scheduled for above procedure 11/18/21 with Dr. Harrell Gave Lovena Neighbours.  ? ?Elevated AST/ALT at PAT visit.   ? ?Lab Results  ?Component Value Date  ? ALT 524 (H) 11/12/2021  ? AST 279 (H) 11/12/2021  ? ALKPHOS 50 11/12/2021  ? BILITOT 0.7 11/12/2021  ? ?Discussed with surgeon's office.  Labs forwarded to PCP, pt needs evaluation.  ? ?Addendum:  ?Pt has been referred to GI by PCP.  He has a h/o Hep C in the past, unsure if it has been treated.  Dr. Lovena Neighbours will proceed with the case tomorrow.  ? ?Labs from 11/16/21 on chart.  ?VS: BP (!) 139/98   Pulse 84   Temp 36.8 ?C (Oral)   Resp 16   Ht 5\' 7"  (1.702 m)   Wt 86.2 kg   SpO2 98%   BMI 29.76 kg/m?  ? ?PROVIDERS: ?Townsend Roger, MD is PCP  ? ? ?LABS:  forwarded to surgeon and PCP ?(all labs ordered are listed, but only abnormal results are displayed) ? ?Labs Reviewed  ?COMPREHENSIVE METABOLIC PANEL - Abnormal; Notable for the following components:  ?    Result Value  ? Glucose, Bld 118 (*)   ? AST 279 (*)   ? ALT 524 (*)   ? All other components within normal limits  ? ? ? ?IMAGES: ? ? ?EKG: ? ? ?CV: ? ?Past Medical History:  ?Diagnosis Date  ? Anxiety   ? History of kidney stones   ? ? ?Past Surgical History:  ?Procedure Laterality Date  ? WISDOM TOOTH EXTRACTION    ? ? ?MEDICATIONS: ? cephALEXin (KEFLEX) 500 MG capsule  ? clonazePAM (KLONOPIN) 0.5 MG tablet  ? Fructose-Dextrose-Phosphor Acd (EQ ANTI-NAUSEA PO)  ? ibuprofen (ADVIL) 200 MG tablet  ? oxyCODONE-acetaminophen (PERCOCET/ROXICET) 5-325 MG tablet  ? ?No current facility-administered medications for this encounter.   ? ? ? ? ?Konrad Felix Ward, PA-C ?[facility] ?[phone] ? ? ? ? ?

## 2021-11-17 NOTE — Anesthesia Preprocedure Evaluation (Addendum)
Anesthesia Evaluation  ?Patient identified by MRN, date of birth, ID band ?Patient awake ? ? ? ?Reviewed: ?Allergy & Precautions, NPO status , Patient's Chart, lab work & pertinent test results ? ?Airway ?Mallampati: II ? ?TM Distance: >3 FB ?Neck ROM: Full ? ? ? Dental ?no notable dental hx. ? ?  ?Pulmonary ?neg pulmonary ROS,  ?  ?Pulmonary exam normal ?breath sounds clear to auscultation ? ? ? ? ? ? Cardiovascular ?negative cardio ROS ?Normal cardiovascular exam ?Rhythm:Regular Rate:Normal ? ? ?  ?Neuro/Psych ?Anxiety negative neurological ROS ?   ? GI/Hepatic ?negative GI ROS, Neg liver ROS,   ?Endo/Other  ?negative endocrine ROS ? Renal/GU ?negative Renal ROS  ? ?  ?Musculoskeletal ?negative musculoskeletal ROS ?(+)  ? Abdominal ?  ?Peds ? Hematology ?negative hematology ROS ?(+)   ?Anesthesia Other Findings ?RIGHT URETERAL STONE ? Reproductive/Obstetrics ? ?  ? ? ? ? ? ? ? ? ? ? ? ? ? ?  ?  ? ? ? ? ? ? ? ?Anesthesia Physical ?Anesthesia Plan ? ?ASA: 2 ? ?Anesthesia Plan: General  ? ?Post-op Pain Management:   ? ?Induction: Intravenous ? ?PONV Risk Score and Plan: 3 and Ondansetron, Dexamethasone, Midazolam and Treatment may vary due to age or medical condition ? ?Airway Management Planned: LMA ? ?Additional Equipment:  ? ?Intra-op Plan:  ? ?Post-operative Plan: Extubation in OR ? ?Informed Consent: I have reviewed the patients History and Physical, chart, labs and discussed the procedure including the risks, benefits and alternatives for the proposed anesthesia with the patient or authorized representative who has indicated his/her understanding and acceptance.  ? ? ? ?Dental advisory given ? ?Plan Discussed with: CRNA ? ?Anesthesia Plan Comments: (PAT note 11/12/21, Jodell Cipro Ward, PA-C)  ? ? ? ? ?Anesthesia Quick Evaluation ? ?

## 2021-11-18 ENCOUNTER — Ambulatory Visit (HOSPITAL_COMMUNITY)
Admission: RE | Admit: 2021-11-18 | Discharge: 2021-11-18 | Disposition: A | Discharge status: Home / Self Care | Payer: BC Managed Care – PPO | Attending: Urology | Admitting: Urology

## 2021-11-18 ENCOUNTER — Encounter (HOSPITAL_COMMUNITY): Payer: Self-pay | Admitting: Urology

## 2021-11-18 ENCOUNTER — Ambulatory Visit (HOSPITAL_COMMUNITY): Payer: BC Managed Care – PPO | Admitting: Registered Nurse

## 2021-11-18 ENCOUNTER — Ambulatory Visit (HOSPITAL_COMMUNITY): Payer: BC Managed Care – PPO | Admitting: Physician Assistant

## 2021-11-18 ENCOUNTER — Encounter (HOSPITAL_COMMUNITY)
Admission: RE | Disposition: A | Discharge status: Home / Self Care | Payer: Self-pay | Source: Home / Self Care | Attending: Urology

## 2021-11-18 ENCOUNTER — Ambulatory Visit (HOSPITAL_COMMUNITY): Payer: BC Managed Care – PPO

## 2021-11-18 DIAGNOSIS — F17200 Nicotine dependence, unspecified, uncomplicated: Secondary | ICD-10-CM | POA: Insufficient documentation

## 2021-11-18 DIAGNOSIS — Z87442 Personal history of urinary calculi: Secondary | ICD-10-CM | POA: Insufficient documentation

## 2021-11-18 DIAGNOSIS — N201 Calculus of ureter: Secondary | ICD-10-CM | POA: Diagnosis not present

## 2021-11-18 HISTORY — PX: CYSTOSCOPY/URETEROSCOPY/HOLMIUM LASER/STENT PLACEMENT: SHX6546

## 2021-11-18 SURGERY — CYSTOSCOPY/URETEROSCOPY/HOLMIUM LASER/STENT PLACEMENT
Anesthesia: General | Laterality: Right

## 2021-11-18 MED ORDER — KETOROLAC TROMETHAMINE 30 MG/ML IJ SOLN
30.0000 mg | Freq: Once | INTRAMUSCULAR | Status: AC
  Administered 2021-11-18: 30 mg via INTRAVENOUS

## 2021-11-18 MED ORDER — OXYCODONE HCL 5 MG PO TABS: 5.0000 mg | ORAL_TABLET | ORAL | 0 refills | Status: AC | PRN

## 2021-11-18 MED ORDER — LACTATED RINGERS IV SOLN: INTRAVENOUS | Status: DC

## 2021-11-18 MED ORDER — FENTANYL CITRATE PF 50 MCG/ML IJ SOSY
25.0000 ug | PREFILLED_SYRINGE | INTRAMUSCULAR | Status: DC | PRN
  Administered 2021-11-18 (×2): 50 ug via INTRAVENOUS

## 2021-11-18 MED ORDER — OXYCODONE HCL 5 MG/5ML PO SOLN: 5.0000 mg | Freq: Once | ORAL | Status: AC | PRN

## 2021-11-18 MED ORDER — OXYCODONE HCL 5 MG PO TABS: 5.0000 mg | ORAL_TABLET | Freq: Once | ORAL | Status: AC | PRN

## 2021-11-18 MED ORDER — MIDAZOLAM HCL 2 MG/2ML IJ SOLN
INTRAMUSCULAR | Status: AC
Start: 2021-11-18 — End: ?
  Filled 2021-11-18: qty 2

## 2021-11-18 MED ORDER — PROPOFOL 10 MG/ML IV BOLUS
INTRAVENOUS | Status: DC | PRN
  Administered 2021-11-18: 160 mg via INTRAVENOUS
  Administered 2021-11-18: 40 mg via INTRAVENOUS

## 2021-11-18 MED ORDER — PROPOFOL 10 MG/ML IV BOLUS
INTRAVENOUS | Status: AC
Start: 2021-11-18 — End: ?
  Filled 2021-11-18: qty 20

## 2021-11-18 MED ORDER — ACETAMINOPHEN 10 MG/ML IV SOLN
INTRAVENOUS | Status: AC
  Filled 2021-11-18: qty 100

## 2021-11-18 MED ORDER — HYDROMORPHONE HCL 1 MG/ML IJ SOLN
INTRAMUSCULAR | Status: AC
  Filled 2021-11-18: qty 1

## 2021-11-18 MED ORDER — ACETAMINOPHEN 10 MG/ML IV SOLN
1000.0000 mg | Freq: Once | INTRAVENOUS | Status: DC | PRN
  Administered 2021-11-18: 1000 mg via INTRAVENOUS

## 2021-11-18 MED ORDER — FENTANYL CITRATE (PF) 100 MCG/2ML IJ SOLN
INTRAMUSCULAR | Status: AC
Start: 2021-11-18 — End: ?
  Filled 2021-11-18: qty 2

## 2021-11-18 MED ORDER — LIDOCAINE HCL (PF) 2 % IJ SOLN
INTRAMUSCULAR | Status: AC
  Filled 2021-11-18: qty 5

## 2021-11-18 MED ORDER — FENTANYL CITRATE (PF) 100 MCG/2ML IJ SOLN
INTRAMUSCULAR | Status: DC | PRN
Start: 2021-11-18 — End: 2021-11-18
  Administered 2021-11-18: 25 ug via INTRAVENOUS
  Administered 2021-11-18: 75 ug via INTRAVENOUS
  Administered 2021-11-18: 100 ug via INTRAVENOUS

## 2021-11-18 MED ORDER — LIDOCAINE 2% (20 MG/ML) 5 ML SYRINGE
INTRAMUSCULAR | Status: DC | PRN
  Administered 2021-11-18: 75 mg via INTRAVENOUS

## 2021-11-18 MED ORDER — ONDANSETRON HCL 4 MG/2ML IJ SOLN
INTRAMUSCULAR | Status: AC
  Filled 2021-11-18: qty 2

## 2021-11-18 MED ORDER — FENTANYL CITRATE PF 50 MCG/ML IJ SOSY
PREFILLED_SYRINGE | INTRAMUSCULAR | Status: AC
  Administered 2021-11-18: 50 ug via INTRAVENOUS
  Filled 2021-11-18: qty 3

## 2021-11-18 MED ORDER — AMISULPRIDE (ANTIEMETIC) 5 MG/2ML IV SOLN
INTRAVENOUS | Status: AC
  Filled 2021-11-18: qty 4

## 2021-11-18 MED ORDER — DEXAMETHASONE SODIUM PHOSPHATE 10 MG/ML IJ SOLN
INTRAMUSCULAR | Status: DC | PRN
  Administered 2021-11-18: 8 mg via INTRAVENOUS

## 2021-11-18 MED ORDER — IOHEXOL 300 MG/ML  SOLN
INTRAMUSCULAR | Status: DC | PRN
  Administered 2021-11-18: 10 mL

## 2021-11-18 MED ORDER — OXYCODONE HCL 5 MG PO TABS
ORAL_TABLET | ORAL | Status: AC
  Administered 2021-11-18: 5 mg via ORAL
  Filled 2021-11-18: qty 1

## 2021-11-18 MED ORDER — LACTATED RINGERS IV SOLN: INTRAVENOUS | Status: DC | PRN

## 2021-11-18 MED ORDER — KETOROLAC TROMETHAMINE 30 MG/ML IJ SOLN
INTRAMUSCULAR | Status: AC
  Filled 2021-11-18: qty 1

## 2021-11-18 MED ORDER — CIPROFLOXACIN IN D5W 400 MG/200ML IV SOLN
400.0000 mg | INTRAVENOUS | Status: AC
Start: 2021-11-18 — End: 2021-11-18
  Administered 2021-11-18: 400 mg via INTRAVENOUS
  Filled 2021-11-18: qty 200

## 2021-11-18 MED ORDER — ONDANSETRON HCL 4 MG/2ML IJ SOLN
INTRAMUSCULAR | Status: DC | PRN
  Administered 2021-11-18: 4 mg via INTRAVENOUS

## 2021-11-18 MED ORDER — SODIUM CHLORIDE 0.9 % IR SOLN
Status: DC | PRN
  Administered 2021-11-18: 3000 mL

## 2021-11-18 MED ORDER — OXYBUTYNIN CHLORIDE 5 MG PO TABS: 5.0000 mg | ORAL_TABLET | Freq: Three times a day (TID) | ORAL | 1 refills | Status: AC | PRN

## 2021-11-18 MED ORDER — MIDAZOLAM HCL 5 MG/5ML IJ SOLN
INTRAMUSCULAR | Status: DC | PRN
  Administered 2021-11-18: 2 mg via INTRAVENOUS

## 2021-11-18 MED ORDER — HYDROMORPHONE HCL 1 MG/ML IJ SOLN
INTRAMUSCULAR | Status: DC | PRN
  Administered 2021-11-18: 1 mg via INTRAVENOUS

## 2021-11-18 MED ORDER — CHLORHEXIDINE GLUCONATE 0.12 % MT SOLN
15.0000 mL | Freq: Once | OROMUCOSAL | Status: AC
  Administered 2021-11-18: 15 mL via OROMUCOSAL

## 2021-11-18 MED ORDER — PHENAZOPYRIDINE HCL 200 MG PO TABS: 200.0000 mg | ORAL_TABLET | Freq: Three times a day (TID) | ORAL | 0 refills | Status: AC | PRN

## 2021-11-18 MED ORDER — AMISULPRIDE (ANTIEMETIC) 5 MG/2ML IV SOLN
10.0000 mg | Freq: Once | INTRAVENOUS | Status: AC | PRN
  Administered 2021-11-18: 10 mg via INTRAVENOUS

## 2021-11-18 MED ORDER — ORAL CARE MOUTH RINSE: 15.0000 mL | Freq: Once | OROMUCOSAL | Status: AC

## 2021-11-18 SURGICAL SUPPLY — 19 items
BAG URO CATCHER STRL LF (MISCELLANEOUS) ×3 IMPLANT
BASKET ZERO TIP NITINOL 2.4FR (BASKET) ×1 IMPLANT
BSKT STON RTRVL ZERO TP 2.4FR (BASKET) ×1
CATH URETL OPEN 5X70 (CATHETERS) ×3 IMPLANT
CLOTH BEACON ORANGE TIMEOUT ST (SAFETY) ×3 IMPLANT
EXTRACTOR STONE NITINOL NGAGE (UROLOGICAL SUPPLIES) IMPLANT
GLOVE SURG ENC TEXT LTX SZ7.5 (GLOVE) ×5 IMPLANT
GOWN STRL REUS W/TWL XL LVL3 (GOWN DISPOSABLE) ×4 IMPLANT
GUIDEWIRE STR DUAL SENSOR (WIRE) ×1 IMPLANT
GUIDEWIRE ZIPWRE .038 STRAIGHT (WIRE) ×3 IMPLANT
LASER FIB FLEXIVA PULSE ID 365 (Laser) IMPLANT
MANIFOLD NEPTUNE II (INSTRUMENTS) ×3 IMPLANT
PACK CYSTO (CUSTOM PROCEDURE TRAY) ×3 IMPLANT
SHEATH NAVIGATOR HD 11/13X36 (SHEATH) IMPLANT
STENT URET 6FRX26 CONTOUR (STENTS) IMPLANT
TRACTIP FLEXIVA PULS ID 200XHI (Laser) IMPLANT
TRACTIP FLEXIVA PULSE ID 200 (Laser)
TUBING CONNECTING 10 (TUBING) ×3 IMPLANT
TUBING UROLOGY SET (TUBING) ×3 IMPLANT

## 2021-11-18 NOTE — Anesthesia Procedure Notes (Signed)
Procedure Name: LMA Insertion ?Date/Time: 11/18/2021 8:53 AM ?Performed by: Illene Silver, CRNA ?Pre-anesthesia Checklist: Patient identified, Emergency Drugs available, Suction available and Patient being monitored ?Patient Re-evaluated:Patient Re-evaluated prior to induction ?Oxygen Delivery Method: Circle system utilized ?Induction Type: IV induction ?LMA: LMA with gastric port inserted ?LMA Size: 4.0 ?Tube type: Oral ?Placement Confirmation: positive ETCO2 ?Tube secured with: Tape ?Dental Injury: Teeth and Oropharynx as per pre-operative assessment  ? ? ? ? ?

## 2021-11-18 NOTE — Transfer of Care (Signed)
Immediate Anesthesia Transfer of Care Note ? ?Patient: [name] ? ?Procedure(s) Performed: CYSTOSCOPY/URETEROSCOPY/HOLMIUM LASER/STENT PLACEMENT (Right) ? ?Patient Location: PACU ? ?Anesthesia Type:General ? ?Level of Consciousness: awake, alert , oriented and patient cooperative ? ?Airway & Oxygen Therapy: Patient Spontanous Breathing and Patient connected to face mask oxygen ? ?Post-op Assessment: Report given to RN, Post -op Vital signs reviewed and stable and Patient moving all extremities X 4 ? ?Post vital signs: stable ? ?Last Vitals:  ?Vitals Value Taken Time  ?BP 139/92 11/18/21 0945  ?Temp    ?Pulse 57 11/18/21 0947  ?Resp    ?SpO2 100 % 11/18/21 0947  ?Vitals shown include unvalidated device data. ? ?Last Pain:  ?Vitals:  ? 11/18/21 0657  ?TempSrc: Oral  ?   ? ?  ? ?Complications: No notable events documented. ?

## 2021-11-18 NOTE — H&P (Addendum)
PRE-OP H&P ? ?Office Visit Report     11/02/2021  ? ?-------------------------------------------------------------------------------- ?  ?[name]  ?MRN: [id number]  ?DOB: December 27, 1985, 36 year old Male  ?SSN:   ? PRIMARY CARE:  Crist Fat, MD  ?REFERRING:    ?PROVIDER:  Rhoderick Moody, M.D.  ?LOCATION:  [facility] [phone]  ?  ? ?-------------------------------------------------------------------------------- ?  ?CC: I have pain in the flank.  ?HPI: [name] is a 35 year-old male patient who is here for flank pain. ? ?The problem is on the right side. His pain started about 10/28/2021. The pain is sharp. The pain is intermittent. The pain does radiate.  ? ?Lying down and resting< makes the pain better. Twisting makes the pain worse. He was treated with the following pain medication(s): Toradol and Oxycodone.  ? ?He has had this same pain previously. He has had kidney stones.  ? ?-CTSS from 10/28/21 at [facility] ER showed a 3 mm right mid-ureteral calculus  ?-He notes intermittent episodes of sharp, non-radiating right sided flank pain-- the pain was intense yesterday, but has since significantly improved  ?-Pt was seen back in the ER last night due to acute right flank pain---Abd [place] from 11/01/21 showed no evidence of hydronephrosis  ?-He denies fever/chills, dysuria or gross hematuria  ?-He states that his right sided flank pain has significantly improved over the past 12 hours, but he has not see a stone pass despite straining his urine.  ? ?  ?ALLERGIES: Penicillin ?  ? ?MEDICATIONS: Cephalexin 500 mg capsule  ?Clonazepam 0.5 mg tablet tablet on hold  ?Flomax 0.4 mg capsule  ?Oxycodone-Acetaminophen 5 mg-325 mg tablet  ?Potassium  ?Zofran  ?  ? ?GU PSH: None  ? ?NON-GU PSH: None  ? ?GU PMH: Hep C ? ?NON-GU PMH: Anxiety ?  ? ?FAMILY HISTORY: Diabetes - Father ?Kidney Stones - Mother  ? ?SOCIAL HISTORY: Marital Status: Single ?Preferred Language: [place]; Ethnicity: Not Hispanic Or  Latino; Race: White ?Current Smoking Status: Patient smokes occasionally.  ? ?Tobacco Use Assessment Completed: Used Tobacco in last 30 days? ?Social Drinker.  ?Drinks 2 caffeinated drinks per day. ?  ? ?REVIEW OF SYSTEMS:    ?GU Review Male:   Patient reports frequent urination. Patient denies hard to postpone urination, burning/ pain with urination, get up at night to urinate, leakage of urine, stream starts and stops, trouble starting your stream, have to strain to urinate , erection problems, and penile pain.  ?Gastrointestinal (Upper):   Patient denies nausea, vomiting, and indigestion/ heartburn.  ?Gastrointestinal (Lower):   Patient denies diarrhea and constipation.  ?Constitutional:   Patient denies fever, night sweats, weight loss, and fatigue.  ?Skin:   Patient denies skin rash/ lesion and itching.  ?Eyes:   Patient denies blurred vision and double vision.  ?Ears/ Nose/ Throat:   Patient denies sore throat and sinus problems.  ?Hematologic/Lymphatic:   Patient denies swollen glands and easy bruising.  ?Cardiovascular:   Patient denies leg swelling and chest pains.  ?Respiratory:   Patient denies cough and shortness of breath.  ?Endocrine:   Patient denies excessive thirst.  ?Musculoskeletal:   Patient denies back pain and joint pain.  ?Neurological:   Patient denies headaches and dizziness.  ?Psychologic:   Patient denies depression and anxiety.  ? ?VITAL SIGNS:    ?  11/02/2021 10:15 AM  ?Weight 195 lb / 88.45 kg  ?Height 69 in / 175.26 cm  ?BP 119/78 mmHg  ?Pulse 71 /min  ?Temperature  97.3 F / 36.2 C  ?BMI 28.8 kg/m?  ? ?GU PHYSICAL EXAMINATION:    ?Anus and Perineum: No hemorrhoids. No anal stenosis. No rectal fissure, no anal fissure. No edema, no dimple, no perineal tenderness, no anal tenderness.  ?Scrotum: No lesions. No edema. No cysts. No warts.  ?Epididymides: Right: no spermatocele, no masses, no cysts, no tenderness, no induration, no enlargement. Left: no spermatocele, no masses, no cysts, no  tenderness, no induration, no enlargement.  ?Testes: No tenderness, no swelling, no enlargement left testes. No tenderness, no swelling, no enlargement right testes. Normal location left testes. Normal location right testes. No mass, no cyst, no varicocele, no hydrocele left testes. No mass, no cyst, no varicocele, no hydrocele right testes.  ?Urethral Meatus: Normal size. No lesion, no wart, no discharge, no polyp. Normal location.  ?Penis: Circumcised, no warts, no cracks. No dorsal Peyronie's plaques, no left corporal Peyronie's plaques, no right corporal Peyronie's plaques, no scarring, no warts. No balanitis, no meatal stenosis.  ?Prostate: 40 gram or 2+ size. Left lobe normal consistency, right lobe normal consistency. Symmetrical lobes. No prostate nodule. Left lobe no tenderness, right lobe no tenderness.  ?Seminal Vesicles: Nonpalpable.  ?Sphincter Tone: Normal sphincter. No rectal tenderness. No rectal mass.   ? ?MULTI-SYSTEM PHYSICAL EXAMINATION:    ? ?  ?Complexity of Data:  ?Lab Test Review:   BMP, CBC with Diff  ?Records Review:   Previous Patient Records  ?X-Ray Review: Gallbladder Ultrasound: Reviewed Films. Reviewed Report. Discussed With Patient. CLINICAL DATA: Abdominal pain.  ?   ?EXAM:  ?ABDOMEN ULTRASOUND COMPLETE  ?   ?COMPARISON: CT abdomen 09/16/2014  ?   ?FINDINGS:  ?Gallbladder: No gallstones or wall thickening visualized. No  ?sonographic Murphy sign noted by sonographer.  ?   ?Common bile duct: Diameter: 4 mm  ?   ?Liver: No focal lesion identified. Within normal limits in  ?parenchymal echogenicity. Portal vein is patent on color Doppler  ?imaging with normal direction of blood flow towards the liver.  ?   ?IVC: Limited visualization secondary to overlying bowel gas.  ?   ?Pancreas: Limited visualization secondary to overlying bowel gas.  ?   ?Spleen: Size and appearance within normal limits.  ?   ?Right Kidney: Length: 10.7 cm. Echogenicity within normal limits. No  ?mass or  hydronephrosis visualized.  ?   ?Left Kidney: Length: 11.1 cm. Echogenicity within normal limits. No  ?mass or hydronephrosis visualized.  ?   ?Abdominal aorta: No aneurysm visualized.  ?   ?Other findings: None.  ?   ?IMPRESSION:  ?1. No cholelithiasis or sonographic evidence of acute cholecystitis.  ?2. No hydronephrosis.  ?   ?   ?Electronically Signed  ?By: Elige KoHetal Patel M.D.  ?On: 11/01/2021 15:04 ?C.T. Abdomen/Pelvis: Reviewed Films. Reviewed Report. Discussed With Patient.  ?  ? ?PROCEDURES:    ? ?     Urinalysis w/Scope ?Dipstick Dipstick Cont'd Micro  ?Color: Yellow Bilirubin: Neg mg/dL WBC/hpf: 10 - 16/XWR20/hpf  ?Appearance: Clear Ketones: Neg mg/dL RBC/hpf: 0 - 2/hpf  ?Specific Gravity: 1.020 Blood: 1+ ery/uL Bacteria: NS (Not Seen)  ?pH: 6.0 Protein: Trace mg/dL Cystals: NS (Not Seen)  ?Glucose: Neg mg/dL Urobilinogen: 1.0 mg/dL Casts: NS (Not Seen)  ?  Nitrites: Neg Trichomonas: Not Present  ?  Leukocyte Esterase: 1+ leu/uL Mucous: Present  ?    Epithelial Cells: 0 - 5/hpf  ?    Yeast: NS (Not Seen)  ?    Sperm: Not Present  ? ? ?  ASSESSMENT:  ?    ICD-10 Details  ?1 GU:   Flank Pain - R10.84 Right, Undiagnosed New Problem  ?2   Ureteral calculus - N20.1 Right, Undiagnosed New Problem  ? ?PLAN:    ? ? ?      Medications ?New Meds: Ketorolac Tromethamine 10 mg tablet 1 tablet PO Q 6 H PRN   #20  0 Refill(s)  ?Tamsulosin Hcl 0.4 mg capsule 1 capsule PO Daily   #30  0 Refill(s)  ?Pharmacy Name:  [facility] [phone]  ?Address:  [address]  ? [place], [place] [address]  ?Phone:  [phone]  ?Fax:  [phone]  ?  ? ? ?      Schedule ?Return Visit/Planned Activity: Next Available Appointment - Schedule Surgery  ? ? ?      Document ?Letter(s):  Created for Patient: Clinical Summary  ? ? ?     Notes:   The risks, benefits and alternatives of cystoscopy with RIGHT ureteroscopy, laser lithotripsy and ureteral stent placement was discussed the patient. Risks included, but are not limited to: bleeding, urinary tract  infection, ureteral injury/avulsion, ureteral stricture formation, retained stone fragments, the possibility that multiple surgeries may be required to treat the stone(s), MI, stroke, PE and the inherent risks of g

## 2021-11-18 NOTE — Progress Notes (Signed)
Patient contact called, but did not answer.  ?

## 2021-11-18 NOTE — Anesthesia Postprocedure Evaluation (Signed)
Anesthesia Post Note ? ?Patient: [name] ? ?Procedure(s) Performed: CYSTOSCOPY/URETEROSCOPY/STENT PLACEMENT (Right) ? ?  ? ?Patient location during evaluation: PACU ?Anesthesia Type: General ?Level of consciousness: awake ?Pain management: pain level controlled ?Vital Signs Assessment: post-procedure vital signs reviewed and stable ?Respiratory status: spontaneous breathing, nonlabored ventilation, respiratory function stable and patient connected to nasal cannula oxygen ?Cardiovascular status: blood pressure returned to baseline and stable ?Postop Assessment: no apparent nausea or vomiting ?Anesthetic complications: no ? ? ?No notable events documented. ? ?Last Vitals:  ?Vitals:  ? 11/18/21 1015 11/18/21 1025  ?BP: 122/78 (!) 153/95  ?Pulse: (!) 54 (!) 57  ?Resp: 16 16  ?Temp: 36.7 ?C 36.7 ?C  ?SpO2: 98% 96%  ?  ?Last Pain:  ?Vitals:  ? 11/18/21 1025  ?TempSrc:   ?PainSc: 3   ? ? ?  ?  ?  ?  ?  ?  ? ?Daxson Reffett P Finnbar Cedillos ? ? ? ? ?

## 2021-11-18 NOTE — Op Note (Signed)
Operative Note ? ?Preoperative diagnosis:  ?1.  3 mm right mid ureteral calculus ? ?Postoperative diagnosis: ?1.  3 mm right mid ureteral calculus ? ?Procedure(s): ?1.  Cystoscopy with right ureteroscopy with basket stone extraction and right ureteral stent placement ?2.  Right retrograde pyelogram with intraoperative interpretation of fluoroscopic imaging ? ?Surgeon: Rhoderick Moody, MD ? ?Assistants:  None ? ?Anesthesia:  General ? ?Complications:  None ? ?EBL: Less than 5 mL ? ?Specimens: ?1.  Right ureteral calculus was extracted and sent with the patient for analysis at his postop visit ? ?Drains/Catheters: ?1.  Right 6 French, 26 cm JJ stent without tether ? ?Intraoperative findings:   ?Obstructing and impacted 3 mm right mid ureteral calculus ?Right retrograde pyelogram revealed obstruction of flow of contrast beyond the level of the mid ureter.  Once the catheter was navigated beyond the level of obstruction, the proximal aspects of the right ureter and renal pelvis were uniformly dilated with no internal filling defects ? ?Indication:  [name] is a 36 y.o. male with an obstructing 3 mm right mid ureteral calculus.  He has been consented for the above procedures, voices understanding and wishes to proceed. ? ?Description of procedure: ? ?After informed consent was obtained, the patient was brought to the operating room and general anesthesia was administered. The patient was then placed in the dorsolithotomy position and prepped and draped in the usual sterile fashion. A timeout was performed. A 23 French rigid cystoscope was then inserted into the urethral meatus and advanced into the bladder under direct vision. A complete bladder survey revealed no intravesical pathology. ? ?A 5 French ureteral catheter was then inserted into the right ureteral orifice and a retrograde pyelogram was obtained, with the findings listed above.  A Glidewire was then used to intubate the lumen of the ureteral  catheter and was advanced up to the right renal pelvis, under fluoroscopic guidance.  The catheter was then removed, leaving the wire in place. ? ?A semirigid ureteroscope was then advanced into the bladder and up the right ureteral orifice where an obstructing 3 mm mid ureteral calculus was identified.  There was a moderate amount of ureteral edema and mucosal erythema where the stone was obstructing.  A 0 tip basket was then used to extract the stone atraumatically. ? ?The semirigid ureteroscope was then exchanged for the rigid cystoscope.  A 6 French, 26 cm JJ stent was then advanced over the wire and into good position within the right collecting system, confirming placement via fluoroscopy. ? ?The patient's bladder was drained.  His stone was extracted from the bladder.  He tolerated the procedure well and was transferred to the postanesthesia in stable condition. ? ?Plan:  Follow-up in 1 week for office cystoscopy and stent removal ? ?

## 2021-11-19 ENCOUNTER — Encounter (HOSPITAL_COMMUNITY): Payer: Self-pay | Admitting: Urology

## 2021-11-25 DIAGNOSIS — N201 Calculus of ureter: Secondary | ICD-10-CM | POA: Diagnosis not present

## 2021-11-25 DIAGNOSIS — R8271 Bacteriuria: Secondary | ICD-10-CM | POA: Diagnosis not present

## 2022-01-20 DIAGNOSIS — F410 Panic disorder [episodic paroxysmal anxiety] without agoraphobia: Secondary | ICD-10-CM | POA: Diagnosis not present

## 2022-01-20 DIAGNOSIS — F411 Generalized anxiety disorder: Secondary | ICD-10-CM | POA: Diagnosis not present

## 2022-04-15 DIAGNOSIS — F411 Generalized anxiety disorder: Secondary | ICD-10-CM | POA: Diagnosis not present

## 2022-04-15 DIAGNOSIS — B372 Candidiasis of skin and nail: Secondary | ICD-10-CM | POA: Diagnosis not present

## 2022-04-15 DIAGNOSIS — F909 Attention-deficit hyperactivity disorder, unspecified type: Secondary | ICD-10-CM | POA: Diagnosis not present

## 2022-04-15 DIAGNOSIS — Z202 Contact with and (suspected) exposure to infections with a predominantly sexual mode of transmission: Secondary | ICD-10-CM | POA: Diagnosis not present

## 2022-05-20 DIAGNOSIS — F411 Generalized anxiety disorder: Secondary | ICD-10-CM | POA: Diagnosis not present

## 2022-05-20 DIAGNOSIS — F410 Panic disorder [episodic paroxysmal anxiety] without agoraphobia: Secondary | ICD-10-CM | POA: Diagnosis not present

## 2022-05-24 DIAGNOSIS — F419 Anxiety disorder, unspecified: Secondary | ICD-10-CM | POA: Diagnosis not present

## 2022-05-24 DIAGNOSIS — F13939 Sedative, hypnotic or anxiolytic use, unspecified with withdrawal, unspecified: Secondary | ICD-10-CM | POA: Diagnosis not present

## 2022-06-10 DIAGNOSIS — F909 Attention-deficit hyperactivity disorder, unspecified type: Secondary | ICD-10-CM | POA: Diagnosis not present

## 2022-06-10 DIAGNOSIS — B009 Herpesviral infection, unspecified: Secondary | ICD-10-CM | POA: Diagnosis not present

## 2022-06-10 DIAGNOSIS — F419 Anxiety disorder, unspecified: Secondary | ICD-10-CM | POA: Diagnosis not present

## 2022-06-10 DIAGNOSIS — F32A Depression, unspecified: Secondary | ICD-10-CM | POA: Diagnosis not present

## 2022-06-17 DIAGNOSIS — F901 Attention-deficit hyperactivity disorder, predominantly hyperactive type: Secondary | ICD-10-CM | POA: Diagnosis not present

## 2022-06-17 DIAGNOSIS — F411 Generalized anxiety disorder: Secondary | ICD-10-CM | POA: Diagnosis not present

## 2022-06-17 DIAGNOSIS — F410 Panic disorder [episodic paroxysmal anxiety] without agoraphobia: Secondary | ICD-10-CM | POA: Diagnosis not present

## 2022-08-05 DIAGNOSIS — F901 Attention-deficit hyperactivity disorder, predominantly hyperactive type: Secondary | ICD-10-CM | POA: Diagnosis not present

## 2022-08-05 DIAGNOSIS — F410 Panic disorder [episodic paroxysmal anxiety] without agoraphobia: Secondary | ICD-10-CM | POA: Diagnosis not present

## 2022-08-05 DIAGNOSIS — F411 Generalized anxiety disorder: Secondary | ICD-10-CM | POA: Diagnosis not present

## 2022-08-16 DIAGNOSIS — K529 Noninfective gastroenteritis and colitis, unspecified: Secondary | ICD-10-CM | POA: Diagnosis not present

## 2022-08-16 DIAGNOSIS — R1110 Vomiting, unspecified: Secondary | ICD-10-CM | POA: Diagnosis not present

## 2022-08-16 DIAGNOSIS — R1084 Generalized abdominal pain: Secondary | ICD-10-CM | POA: Diagnosis not present

## 2022-09-02 DIAGNOSIS — F411 Generalized anxiety disorder: Secondary | ICD-10-CM | POA: Diagnosis not present

## 2022-09-02 DIAGNOSIS — F410 Panic disorder [episodic paroxysmal anxiety] without agoraphobia: Secondary | ICD-10-CM | POA: Diagnosis not present

## 2022-09-02 DIAGNOSIS — F901 Attention-deficit hyperactivity disorder, predominantly hyperactive type: Secondary | ICD-10-CM | POA: Diagnosis not present

## 2022-09-30 DIAGNOSIS — F410 Panic disorder [episodic paroxysmal anxiety] without agoraphobia: Secondary | ICD-10-CM | POA: Diagnosis not present

## 2022-09-30 DIAGNOSIS — F901 Attention-deficit hyperactivity disorder, predominantly hyperactive type: Secondary | ICD-10-CM | POA: Diagnosis not present

## 2022-09-30 DIAGNOSIS — F411 Generalized anxiety disorder: Secondary | ICD-10-CM | POA: Diagnosis not present

## 2022-10-28 DIAGNOSIS — F901 Attention-deficit hyperactivity disorder, predominantly hyperactive type: Secondary | ICD-10-CM | POA: Diagnosis not present

## 2022-10-28 DIAGNOSIS — F411 Generalized anxiety disorder: Secondary | ICD-10-CM | POA: Diagnosis not present

## 2022-10-28 DIAGNOSIS — F410 Panic disorder [episodic paroxysmal anxiety] without agoraphobia: Secondary | ICD-10-CM | POA: Diagnosis not present

## 2022-11-03 DIAGNOSIS — R8279 Other abnormal findings on microbiological examination of urine: Secondary | ICD-10-CM | POA: Diagnosis not present

## 2022-11-03 DIAGNOSIS — K2970 Gastritis, unspecified, without bleeding: Secondary | ICD-10-CM | POA: Diagnosis not present

## 2022-11-25 DIAGNOSIS — F411 Generalized anxiety disorder: Secondary | ICD-10-CM | POA: Diagnosis not present

## 2022-11-25 DIAGNOSIS — F901 Attention-deficit hyperactivity disorder, predominantly hyperactive type: Secondary | ICD-10-CM | POA: Diagnosis not present

## 2022-11-25 DIAGNOSIS — F410 Panic disorder [episodic paroxysmal anxiety] without agoraphobia: Secondary | ICD-10-CM | POA: Diagnosis not present

## 2022-12-23 DIAGNOSIS — F901 Attention-deficit hyperactivity disorder, predominantly hyperactive type: Secondary | ICD-10-CM | POA: Diagnosis not present

## 2022-12-23 DIAGNOSIS — F410 Panic disorder [episodic paroxysmal anxiety] without agoraphobia: Secondary | ICD-10-CM | POA: Diagnosis not present

## 2022-12-23 DIAGNOSIS — F411 Generalized anxiety disorder: Secondary | ICD-10-CM | POA: Diagnosis not present

## 2023-01-20 DIAGNOSIS — F411 Generalized anxiety disorder: Secondary | ICD-10-CM | POA: Diagnosis not present

## 2023-01-20 DIAGNOSIS — F901 Attention-deficit hyperactivity disorder, predominantly hyperactive type: Secondary | ICD-10-CM | POA: Diagnosis not present

## 2023-01-20 DIAGNOSIS — F410 Panic disorder [episodic paroxysmal anxiety] without agoraphobia: Secondary | ICD-10-CM | POA: Diagnosis not present

## 2023-02-17 DIAGNOSIS — F411 Generalized anxiety disorder: Secondary | ICD-10-CM | POA: Diagnosis not present

## 2023-02-17 DIAGNOSIS — F901 Attention-deficit hyperactivity disorder, predominantly hyperactive type: Secondary | ICD-10-CM | POA: Diagnosis not present

## 2023-02-17 DIAGNOSIS — F410 Panic disorder [episodic paroxysmal anxiety] without agoraphobia: Secondary | ICD-10-CM | POA: Diagnosis not present

## 2023-03-17 DIAGNOSIS — F901 Attention-deficit hyperactivity disorder, predominantly hyperactive type: Secondary | ICD-10-CM | POA: Diagnosis not present

## 2023-03-17 DIAGNOSIS — F410 Panic disorder [episodic paroxysmal anxiety] without agoraphobia: Secondary | ICD-10-CM | POA: Diagnosis not present

## 2023-03-17 DIAGNOSIS — F411 Generalized anxiety disorder: Secondary | ICD-10-CM | POA: Diagnosis not present

## 2023-04-14 DIAGNOSIS — F901 Attention-deficit hyperactivity disorder, predominantly hyperactive type: Secondary | ICD-10-CM | POA: Diagnosis not present

## 2023-04-14 DIAGNOSIS — F411 Generalized anxiety disorder: Secondary | ICD-10-CM | POA: Diagnosis not present

## 2023-04-14 DIAGNOSIS — F410 Panic disorder [episodic paroxysmal anxiety] without agoraphobia: Secondary | ICD-10-CM | POA: Diagnosis not present

## 2023-05-12 DIAGNOSIS — F410 Panic disorder [episodic paroxysmal anxiety] without agoraphobia: Secondary | ICD-10-CM | POA: Diagnosis not present

## 2023-05-12 DIAGNOSIS — F901 Attention-deficit hyperactivity disorder, predominantly hyperactive type: Secondary | ICD-10-CM | POA: Diagnosis not present

## 2023-05-12 DIAGNOSIS — F411 Generalized anxiety disorder: Secondary | ICD-10-CM | POA: Diagnosis not present

## 2023-06-09 DIAGNOSIS — F411 Generalized anxiety disorder: Secondary | ICD-10-CM | POA: Diagnosis not present

## 2023-06-09 DIAGNOSIS — F901 Attention-deficit hyperactivity disorder, predominantly hyperactive type: Secondary | ICD-10-CM | POA: Diagnosis not present

## 2023-06-09 DIAGNOSIS — F410 Panic disorder [episodic paroxysmal anxiety] without agoraphobia: Secondary | ICD-10-CM | POA: Diagnosis not present

## 2023-07-07 DIAGNOSIS — F410 Panic disorder [episodic paroxysmal anxiety] without agoraphobia: Secondary | ICD-10-CM | POA: Diagnosis not present

## 2023-07-07 DIAGNOSIS — F901 Attention-deficit hyperactivity disorder, predominantly hyperactive type: Secondary | ICD-10-CM | POA: Diagnosis not present

## 2023-07-07 DIAGNOSIS — F411 Generalized anxiety disorder: Secondary | ICD-10-CM | POA: Diagnosis not present

## 2023-08-04 DIAGNOSIS — F411 Generalized anxiety disorder: Secondary | ICD-10-CM | POA: Diagnosis not present

## 2023-08-04 DIAGNOSIS — F410 Panic disorder [episodic paroxysmal anxiety] without agoraphobia: Secondary | ICD-10-CM | POA: Diagnosis not present

## 2023-08-04 DIAGNOSIS — F901 Attention-deficit hyperactivity disorder, predominantly hyperactive type: Secondary | ICD-10-CM | POA: Diagnosis not present
